# Patient Record
Sex: Female | Born: 1980 | Race: White | Hispanic: No | Marital: Married | Smoking: Never smoker
Health system: Southern US, Community
[De-identification: ages and names within clinical notes are randomized; demographics above are authoritative.]

## PROBLEM LIST (undated history)

## (undated) DIAGNOSIS — J45909 Unspecified asthma, uncomplicated: Secondary | ICD-10-CM

## (undated) DIAGNOSIS — I10 Essential (primary) hypertension: Secondary | ICD-10-CM

## (undated) DIAGNOSIS — E162 Hypoglycemia, unspecified: Secondary | ICD-10-CM

## (undated) DIAGNOSIS — R569 Unspecified convulsions: Secondary | ICD-10-CM

## (undated) HISTORY — DX: Essential (primary) hypertension: I10

## (undated) HISTORY — PX: ADENOIDECTOMY: SUR15

## (undated) HISTORY — PX: TONSILLECTOMY: SUR1361

## (undated) HISTORY — PX: EYE SURGERY: SHX253

---

## 2004-10-15 ENCOUNTER — Inpatient Hospital Stay (HOSPITAL_COMMUNITY): Admission: AD | Admit: 2004-10-15 | Discharge: 2004-10-15 | Payer: Self-pay | Admitting: Obstetrics

## 2004-10-20 ENCOUNTER — Inpatient Hospital Stay (HOSPITAL_COMMUNITY): Admission: AD | Admit: 2004-10-20 | Discharge: 2004-10-20 | Payer: Self-pay | Admitting: Obstetrics

## 2004-10-27 ENCOUNTER — Inpatient Hospital Stay (HOSPITAL_COMMUNITY): Admission: AD | Admit: 2004-10-27 | Discharge: 2004-10-30 | Payer: Self-pay | Admitting: Obstetrics

## 2006-10-21 ENCOUNTER — Ambulatory Visit (HOSPITAL_COMMUNITY): Admission: RE | Admit: 2006-10-21 | Discharge: 2006-10-21 | Payer: Self-pay | Admitting: Pulmonary Disease

## 2006-11-12 ENCOUNTER — Ambulatory Visit (HOSPITAL_COMMUNITY): Admission: RE | Admit: 2006-11-12 | Discharge: 2006-11-12 | Payer: Self-pay | Admitting: Pulmonary Disease

## 2009-10-13 ENCOUNTER — Other Ambulatory Visit: Admission: RE | Admit: 2009-10-13 | Discharge: 2009-10-13 | Payer: Self-pay | Admitting: Obstetrics & Gynecology

## 2010-05-25 ENCOUNTER — Inpatient Hospital Stay (HOSPITAL_COMMUNITY): Admission: AD | Admit: 2010-05-25 | Discharge: 2010-05-25 | Payer: Self-pay | Admitting: Obstetrics and Gynecology

## 2010-05-27 ENCOUNTER — Inpatient Hospital Stay (HOSPITAL_COMMUNITY): Admission: AD | Admit: 2010-05-27 | Discharge: 2010-05-29 | Payer: Self-pay | Admitting: Family Medicine

## 2010-05-27 ENCOUNTER — Ambulatory Visit: Payer: Self-pay | Admitting: Family Medicine

## 2010-10-06 ENCOUNTER — Emergency Department (HOSPITAL_COMMUNITY)
Admission: EM | Admit: 2010-10-06 | Discharge: 2010-10-06 | Disposition: A | Discharge status: Home / Self Care | Payer: Self-pay | Attending: Emergency Medicine | Admitting: Emergency Medicine

## 2010-10-06 DIAGNOSIS — R300 Dysuria: Secondary | ICD-10-CM | POA: Insufficient documentation

## 2010-10-06 DIAGNOSIS — N390 Urinary tract infection, site not specified: Secondary | ICD-10-CM | POA: Insufficient documentation

## 2010-10-06 LAB — URINALYSIS, ROUTINE W REFLEX MICROSCOPIC
Bilirubin Urine: NEGATIVE
Protein, ur: >300 mg/dL — AB
Urobilinogen, UA: 1 mg/dL (ref 0.0–1.0)

## 2010-10-06 LAB — URINE MICROSCOPIC-ADD ON

## 2010-10-08 LAB — URINE CULTURE
Colony Count: >=100000
Culture  Setup Time: 201203021935

## 2010-10-18 LAB — CBC
HCT: 24.2 % — ABNORMAL LOW (ref 36.0–46.0)
HCT: 30.3 % — ABNORMAL LOW (ref 36.0–46.0)
MCH: 28.5 pg (ref 26.0–34.0)
MCHC: 33.3 g/dL (ref 30.0–36.0)
MCHC: 33.7 g/dL (ref 30.0–36.0)
MCV: 85.4 fL (ref 78.0–100.0)
RDW: 18 % — ABNORMAL HIGH (ref 11.5–15.5)
RDW: 18.4 % — ABNORMAL HIGH (ref 11.5–15.5)

## 2010-10-18 LAB — ABO/RH: ABO/RH(D): O POS

## 2010-12-22 NOTE — H&P (Signed)
NAMESHALEA, Patricia Navarro              ACCOUNT NO.:  [id number]   MEDICAL RECORD NO.:  [id number]          PATIENT TYPE:  INP   LOCATION:  9169                          FACILITY:  [facility]   PHYSICIAN:  Roseanna Rainbow, M.D.DATE OF BIRTH:  1981/05/16   DATE OF ADMISSION:  10/27/2004  DATE OF DISCHARGE:                                HISTORY & PHYSICAL   CHIEF COMPLAINT:  The patient is a 30 year old para 1, with an estimated  date of confinement of November 07, 2004, with an intrauterine pregnancy at 44-  4/7th weeks, complaining of ruptured membranes and uterine contractions.  The patient reported a large gush of fluid approximately two hours to  presentation.  She was also complaining of mild uterine contractions.   ANTEPARTUM COURSE:  Prenatal care with Dr. Gaynell Face.   PROBLEMS/RISKS:  GBS positive on October 03, 2004.   PRENATAL SCREENS:  Hemoglobin 12.6, hematocrit 36.7, platelets 257,000.  Blood type O positive.  Rh negative.  RPR nonreactive.  Rubella immune.  Hepatitis B surface antigen negative.  HIV declined.  PPD negative.  Pap  negative.  Gonorrhea negative.  Chlamydia negative.  Triple screen negative.  One hour GCT 123.   PAST OBSTETRICAL/GYNECOLOGICAL HISTORY:  1.  In 1996, she was delivered of a female 6 pounds 9 ounces, full term      spontaneous vaginal delivery, no complications.  2.  She has a history of Chlamydia.   PAST MEDICAL HISTORY:  She denies.   PAST SURGICAL HISTORY:  She denies.   FAMILY HISTORY:  Noncontributory.   SOCIAL HISTORY:  No tobacco, ethanol, or substance abuse.   MEDICATIONS:  Prenatal vitamins.   ALLERGIES:  No known drug allergies.   PHYSICAL EXAMINATION:  VITAL SIGNS:  Stable, afebrile.  Fetal heart tracing  reassuring.  Tocodynamometer with irregular uterine contractions.  GENERAL:  No apparent distress.  ABDOMEN:  Gravid nontender.  PELVIC:  A sterile vaginal exam is per the nurse, 3-cm dilated, 80% effaced,  questionable full  bag palpable.  EXTREMITIES:  No clubbing, cyanosis, or edema.   ASSESSMENT:  1.  Primipara at term.  2.  Early labor.  3.  Group B strep positive.  4.  Fetal heart tracing consistent with fetal well being.   PLAN:  1.  Admission.  2.  Penicillin, GBS prophylaxis.  3.  Expected management.      LAJ/MEDQ  D:  10/28/2004  T:  10/28/2004  Job:  [id number]

## 2011-11-20 ENCOUNTER — Other Ambulatory Visit (HOSPITAL_COMMUNITY)
Admission: RE | Admit: 2011-11-20 | Discharge: 2011-11-20 | Disposition: A | Discharge status: Home / Self Care | Payer: Self-pay | Source: Ambulatory Visit | Attending: Unknown Physician Specialty | Admitting: Unknown Physician Specialty

## 2011-11-20 DIAGNOSIS — R87612 Low grade squamous intraepithelial lesion on cytologic smear of cervix (LGSIL): Secondary | ICD-10-CM | POA: Insufficient documentation

## 2011-11-20 DIAGNOSIS — R87619 Unspecified abnormal cytological findings in specimens from cervix uteri: Secondary | ICD-10-CM | POA: Insufficient documentation

## 2012-10-30 ENCOUNTER — Other Ambulatory Visit (HOSPITAL_COMMUNITY): Payer: Self-pay | Admitting: Pulmonary Disease

## 2012-10-30 DIAGNOSIS — R109 Unspecified abdominal pain: Secondary | ICD-10-CM

## 2012-10-31 ENCOUNTER — Ambulatory Visit (HOSPITAL_COMMUNITY)
Admission: RE | Admit: 2012-10-31 | Discharge: 2012-10-31 | Disposition: A | Discharge status: Home / Self Care | Payer: BC Managed Care – PPO | Source: Ambulatory Visit | Attending: Pulmonary Disease | Admitting: Pulmonary Disease

## 2012-10-31 DIAGNOSIS — R109 Unspecified abdominal pain: Secondary | ICD-10-CM | POA: Insufficient documentation

## 2015-01-31 ENCOUNTER — Emergency Department (HOSPITAL_COMMUNITY)
Admission: EM | Admit: 2015-01-31 | Discharge: 2015-01-31 | Disposition: A | Discharge status: Home / Self Care | Payer: 59 | Attending: Emergency Medicine | Admitting: Emergency Medicine

## 2015-01-31 ENCOUNTER — Encounter (HOSPITAL_COMMUNITY): Payer: Self-pay | Admitting: *Deleted

## 2015-01-31 DIAGNOSIS — W57XXXA Bitten or stung by nonvenomous insect and other nonvenomous arthropods, initial encounter: Secondary | ICD-10-CM | POA: Insufficient documentation

## 2015-01-31 DIAGNOSIS — S80862A Insect bite (nonvenomous), left lower leg, initial encounter: Secondary | ICD-10-CM | POA: Diagnosis not present

## 2015-01-31 DIAGNOSIS — L089 Local infection of the skin and subcutaneous tissue, unspecified: Secondary | ICD-10-CM | POA: Diagnosis not present

## 2015-01-31 DIAGNOSIS — S80861A Insect bite (nonvenomous), right lower leg, initial encounter: Secondary | ICD-10-CM

## 2015-01-31 DIAGNOSIS — Y998 Other external cause status: Secondary | ICD-10-CM | POA: Insufficient documentation

## 2015-01-31 DIAGNOSIS — Y9289 Other specified places as the place of occurrence of the external cause: Secondary | ICD-10-CM | POA: Diagnosis not present

## 2015-01-31 DIAGNOSIS — Z8639 Personal history of other endocrine, nutritional and metabolic disease: Secondary | ICD-10-CM | POA: Insufficient documentation

## 2015-01-31 DIAGNOSIS — Y9389 Activity, other specified: Secondary | ICD-10-CM | POA: Insufficient documentation

## 2015-01-31 HISTORY — DX: Hypoglycemia, unspecified: E16.2

## 2015-01-31 MED ORDER — SULFAMETHOXAZOLE-TRIMETHOPRIM 800-160 MG PO TABS: 1.0000 | ORAL_TABLET | Freq: Two times a day (BID) | ORAL | Status: AC

## 2015-01-31 NOTE — ED Provider Notes (Signed)
CSN: [id number]     Arrival date & time 01/31/15  1256 History  This chart was scribed for non-physician practitioner, Burgess Amor, PA-C working with Bethann Berkshire, MD by Gwenyth Ober, ED scribe. This patient was seen in room APFT23/APFT23 and the patient's care was started at 3:00 PM   Chief Complaint  Patient presents with  . Insect Bite   The history is provided by the patient. No language interpreter was used.    HPI Comments: Patricia Navarro is a 34 y.o. female who presents to the Emergency Department complaining of a gradually worsening, tingling, pruritic area of redness surrounding a central, red wound on her left lower leg that started yesterday. She states swelling of the affected area as an associated symptom. Pt tried Neosporin with no relief. Pt reports that she was cleaning her children's pool when she felt an insect bite, but did not see the insect. She denies a sensation of foreign bodies in the wound. Pt also denies fever and associated pain.  Past Medical History  Diagnosis Date  . Hypoglycemia    Past Surgical History  Procedure Laterality Date  . Tonsillectomy     History reviewed. No pertinent family history. History  Substance Use Topics  . Smoking status: Never Smoker   . Smokeless tobacco: Not on file  . Alcohol Use: No   OB History    No data available     Review of Systems  Constitutional: Negative for fever.  HENT: Negative for congestion and sore throat.   Eyes: Negative.   Respiratory: Negative for chest tightness and shortness of breath.   Cardiovascular: Negative for chest pain.  Gastrointestinal: Negative for nausea and abdominal pain.  Genitourinary: Negative.   Musculoskeletal: Negative for joint swelling, arthralgias and neck pain.  Skin: Positive for color change and wound. Negative for rash.  Neurological: Negative for dizziness, weakness, light-headedness, numbness and headaches.  Psychiatric/Behavioral: Negative.    Allergies   Review of patient's allergies indicates no known allergies.  Home Medications   Prior to Admission medications   Medication Sig Start Date End Date Taking? Authorizing Provider  sulfamethoxazole-trimethoprim (BACTRIM DS,SEPTRA DS) 800-160 MG per tablet Take 1 tablet by mouth 2 (two) times daily. 01/31/15 02/07/15  Burgess Amor, PA-C   BP 125/62 mmHg  Pulse 67  Temp(Src) 98.6 F (37 C) (Oral)  Resp 18  Ht 5\' 4"  (1.626 m)  Wt 180 lb (81.647 kg)  BMI 30.88 kg/m2  SpO2 100%  LMP 01/24/2015 Physical Exam  Constitutional: She appears well-developed and well-nourished. No distress.  HENT:  Head: Normocephalic and atraumatic.  Eyes: Conjunctivae and EOM are normal.  Neck: Neck supple. No tracheal deviation present.  Cardiovascular: Normal rate.   Pedal pulses intact  Pulmonary/Chest: Effort normal. No respiratory distress.  Skin: Skin is warm and dry. There is erythema.  Small 0.5 cm erythematous slightly indurated insect bite with 8.7 x 7 cm surrounding erythema without streaking or induration  Psychiatric: She has a normal mood and affect. Her behavior is normal.  Nursing note and vitals reviewed.   ED Course  Procedures   DIAGNOSTIC STUDIES: Oxygen Saturation is 97% on RA, normal by my interpretation.    COORDINATION OF CARE: 3:10 PM Discussed treatment plan with pt which includes antibiotics. Pt agreed to plan.   Labs Review Labs Reviewed - No data to display  Imaging Review No results found.   EKG Interpretation None      MDM   Final diagnoses:  Insect bite  of leg, infected, right, initial encounter    Exam c/w insect bite with surrounding cellulitis.  Pt was placed on bactrim, advised elevation, hot compresses, f/u with pcp for a recheck in 2 days, sooner for any significantly worsened pain or spreading redness.  Wound edges were marked.  I personally performed the services described in this documentation, which was scribed in my presence. The recorded  information has been reviewed and is accurate.   Burgess Amor, PA-C 02/02/15 2017  Bethann Berkshire, MD 02/04/15 [phone]

## 2015-01-31 NOTE — ED Notes (Addendum)
Site edges marked with dots of a marking pen.Dimensions of pt's insect bite are 8.7cm long and 7cm wide. PA aware.

## 2015-01-31 NOTE — Discharge Instructions (Signed)
Insect Bite Mosquitoes, flies, fleas, bedbugs, and many other insects can bite. Insect bites are different from insect stings. A sting is when venom is injected into the skin. Some insect bites can transmit infectious diseases. SYMPTOMS  Insect bites usually turn red, swell, and itch for 2 to 4 days. They often go away on their own. TREATMENT  Your caregiver may prescribe antibiotic medicines if a bacterial infection develops in the bite. HOME CARE INSTRUCTIONS  Do not scratch the bite area.  Keep the bite area clean and dry. Wash the bite area thoroughly with soap and water.  Put ice or cool compresses on the bite area.  Put ice in a plastic bag.  Place a towel between your skin and the bag.  Leave the ice on for 20 minutes, 4 times a day for the first 2 to 3 days, or as directed.  You may apply a baking soda paste, cortisone cream, or calamine lotion to the bite area as directed by your caregiver. This can help reduce itching and swelling.  Only take over-the-counter or prescription medicines as directed by your caregiver.  If you are given antibiotics, take them as directed. Finish them even if you start to feel better. You may need a tetanus shot if:  You cannot remember when you had your last tetanus shot.  You have never had a tetanus shot.  The injury broke your skin. If you get a tetanus shot, your arm may swell, get red, and feel warm to the touch. This is common and not a problem. If you need a tetanus shot and you choose not to have one, there is a rare chance of getting tetanus. Sickness from tetanus can be serious. SEEK IMMEDIATE MEDICAL CARE IF:   You have increased pain, redness, or swelling in the bite area.  You see a red line on the skin coming from the bite.  You have a fever.  You have joint pain.  You have a headache or neck pain.  You have unusual weakness.  You have a rash.  You have chest pain or shortness of breath.  You have abdominal pain,  nausea, or vomiting.  You feel unusually tired or sleepy. MAKE SURE YOU:   Understand these instructions.  Will watch your condition.  Will get help right away if you are not doing well or get worse. Document Released: 08/30/2004 Document Revised: 10/15/2011 Document Reviewed: 02/21/2011 [facility] Patient Information 2015 [place], [place]. This information is not intended to replace advice given to you by your health care provider. Make sure you discuss any questions you have with your health care provider.   You are being treated for a possibly infected insect bite although this could simply be a localized inflammatory reaction to the insect bite.  Use warm compresses, elevate your leg as much as possible.  Return here for a recheck in 2 days if your symptoms are not improving or you develop any worsened pain, swelling, spreading redness or changes to the bite site as discussed (ulceration, change in color).

## 2015-01-31 NOTE — ED Notes (Signed)
L lower leg spider bite yesterday.  Didn't see the spider.  Dark red central lesion w/lighter red spreading ring surrounding it.  Reports stomach upset and chills yesterday, but none today.

## 2015-03-18 ENCOUNTER — Other Ambulatory Visit: Payer: Self-pay | Admitting: Obstetrics & Gynecology

## 2015-03-18 DIAGNOSIS — O3680X0 Pregnancy with inconclusive fetal viability, not applicable or unspecified: Secondary | ICD-10-CM

## 2015-03-21 ENCOUNTER — Ambulatory Visit (INDEPENDENT_AMBULATORY_CARE_PROVIDER_SITE_OTHER): Payer: 59

## 2015-03-21 DIAGNOSIS — O3680X0 Pregnancy with inconclusive fetal viability, not applicable or unspecified: Secondary | ICD-10-CM | POA: Diagnosis not present

## 2015-03-21 NOTE — Progress Notes (Signed)
[place] [redacted]w[redacted]d single IUP w/ys,pos fht 168bpm,normal ov's bilat, edd 11/01/2015

## 2015-03-29 ENCOUNTER — Encounter: Payer: Self-pay | Admitting: Women's Health

## 2015-03-29 ENCOUNTER — Ambulatory Visit (INDEPENDENT_AMBULATORY_CARE_PROVIDER_SITE_OTHER): Payer: 59 | Admitting: Women's Health

## 2015-03-29 ENCOUNTER — Other Ambulatory Visit (HOSPITAL_COMMUNITY)
Admission: RE | Admit: 2015-03-29 | Discharge: 2015-03-29 | Disposition: A | Discharge status: Home / Self Care | Payer: 59 | Source: Ambulatory Visit | Attending: Obstetrics & Gynecology | Admitting: Obstetrics & Gynecology

## 2015-03-29 VITALS — BP 130/82 | HR 92 | Wt 188.5 lb

## 2015-03-29 DIAGNOSIS — R87810 Cervical high risk human papillomavirus (HPV) DNA test positive: Secondary | ICD-10-CM | POA: Diagnosis present

## 2015-03-29 DIAGNOSIS — Z01411 Encounter for gynecological examination (general) (routine) with abnormal findings: Secondary | ICD-10-CM | POA: Insufficient documentation

## 2015-03-29 DIAGNOSIS — Z1389 Encounter for screening for other disorder: Secondary | ICD-10-CM

## 2015-03-29 DIAGNOSIS — O09299 Supervision of pregnancy with other poor reproductive or obstetric history, unspecified trimester: Secondary | ICD-10-CM | POA: Insufficient documentation

## 2015-03-29 DIAGNOSIS — Z1151 Encounter for screening for human papillomavirus (HPV): Secondary | ICD-10-CM | POA: Diagnosis present

## 2015-03-29 DIAGNOSIS — Z0283 Encounter for blood-alcohol and blood-drug test: Secondary | ICD-10-CM

## 2015-03-29 DIAGNOSIS — Z3490 Encounter for supervision of normal pregnancy, unspecified, unspecified trimester: Secondary | ICD-10-CM | POA: Insufficient documentation

## 2015-03-29 DIAGNOSIS — O09291 Supervision of pregnancy with other poor reproductive or obstetric history, first trimester: Secondary | ICD-10-CM

## 2015-03-29 DIAGNOSIS — Z0189 Encounter for other specified special examinations: Secondary | ICD-10-CM

## 2015-03-29 DIAGNOSIS — Z113 Encounter for screening for infections with a predominantly sexual mode of transmission: Secondary | ICD-10-CM | POA: Insufficient documentation

## 2015-03-29 DIAGNOSIS — Z3491 Encounter for supervision of normal pregnancy, unspecified, first trimester: Secondary | ICD-10-CM

## 2015-03-29 DIAGNOSIS — Z36 Encounter for antenatal screening of mother: Secondary | ICD-10-CM

## 2015-03-29 DIAGNOSIS — Z3682 Encounter for antenatal screening for nuchal translucency: Secondary | ICD-10-CM

## 2015-03-29 DIAGNOSIS — O09521 Supervision of elderly multigravida, first trimester: Secondary | ICD-10-CM

## 2015-03-29 DIAGNOSIS — Z331 Pregnant state, incidental: Secondary | ICD-10-CM

## 2015-03-29 DIAGNOSIS — Z369 Encounter for antenatal screening, unspecified: Secondary | ICD-10-CM

## 2015-03-29 DIAGNOSIS — O09529 Supervision of elderly multigravida, unspecified trimester: Secondary | ICD-10-CM | POA: Insufficient documentation

## 2015-03-29 LAB — POCT URINALYSIS DIPSTICK
GLUCOSE UA: NEGATIVE
Ketones, UA: NEGATIVE
Leukocytes, UA: NEGATIVE
NITRITE UA: NEGATIVE
PROTEIN UA: NEGATIVE
RBC UA: NEGATIVE

## 2015-03-29 NOTE — Progress Notes (Signed)
  Subjective:  Patricia Navarro is a 34 y.o. [phone] Caucasian female at [redacted]w[redacted]d by LMP c/w 8wk u/s, being seen today for her first obstetrical visit.  Her obstetrical history is significant for AMA, h/o term svb x 3, last one weighed 9lb and had 30sec shoulder dystocia resolved w/ mcrobert's & suprapubic pressure- no GDM- states she gained a lot more weight w/ that pregnancy.  Pregnancy history fully reviewed.  Patient reports mild nausea- declines meds. Denies vb, cramping, uti s/s, abnormal/malodorous vag d/c, or vulvovaginal itching/irritation.  BP 130/82 mmHg  Pulse 92  Wt 188 lb 8 oz (85.503 kg)  LMP 01/25/2015 (Exact Date)  HISTORY: OB History  Gravida Para Term Preterm AB SAB TAB Ectopic Multiple Living  # Outcome Date GA Lbr Len/2nd Weight Sex Delivery Anes PTL Lv  4 Current           3 Term 05/28/10 [redacted]w[redacted]d  9 lb (4.082 kg) F Vag-Spont   Y  2 Term 10/28/04 [redacted]w[redacted]d  8 lb 6 oz (3.799 kg) M Vag-Spont   Y  1 Term 06/03/95 [redacted]w[redacted]d  6 lb 9 oz (2.977 kg) F Vag-Spont   Y     Past Medical History  Diagnosis Date  . Hypoglycemia    Past Surgical History  Procedure Laterality Date  . Tonsillectomy     Family History  Problem Relation Age of Onset  . Thyroid disease Mother   . Heart disease Mother   . Asthma Mother   . ADD / ADHD Son   . Heart attack Maternal Grandmother   . Thyroid disease Maternal Grandmother   . Cancer Maternal Grandfather   . Cancer Paternal Grandmother     breast  . Other Paternal Grandmother     aneursym  . Emphysema Paternal Grandfather     Exam   System:     General: Well developed & nourished, no acute distress   Skin: Warm & dry, normal coloration and turgor, no rashes   Neurologic: Alert & oriented, normal mood   Cardiovascular: Regular rate & rhythm   Respiratory: Effort & rate normal, LCTAB, acyanotic   Abdomen: Soft, non tender   Extremities: normal strength, tone   Pelvic Exam:    Perineum: Normal perineum   Vulva:  Normal, no lesions   Vagina:  Normal mucosa, normal discharge   Cervix: Normal, bulbous, appears closed   Uterus: Normal size/shape/contour for GA   Thin prep pap smear obtained w/ high risk HPV cotesting FHR: 167 via informal u/s   Assessment:   Pregnancy: [id number] Patient Active Problem List   Diagnosis Date Noted  . Supervision of normal pregnancy 03/29/2015    Priority: High    [redacted]w[redacted]d [id number] New OB visit H/O shoulder dystocia AMA  Plan:  Initial labs drawn Continue prenatal vitamins Problem list reviewed and updated Reviewed n/v relief measures and warning s/s to report Reviewed recommended weight gain based on pre-gravid BMI Encouraged well-balanced diet Genetic Screening discussed Integrated Screen: requested Cystic fibrosis screening discussed requested Ultrasound discussed; fetal survey: requested Follow up in 3 weeks for 1st it/nt and visit [facility] completed  Marge Duncans CNM, [facility] 03/29/2015 4:35 PM

## 2015-03-29 NOTE — Patient Instructions (Signed)

## 2015-03-31 LAB — URINE CULTURE

## 2015-04-01 LAB — CYTOLOGY - PAP

## 2015-04-04 ENCOUNTER — Encounter: Payer: Self-pay | Admitting: Women's Health

## 2015-04-04 DIAGNOSIS — R87810 Cervical high risk human papillomavirus (HPV) DNA test positive: Secondary | ICD-10-CM | POA: Insufficient documentation

## 2015-04-05 ENCOUNTER — Telehealth: Payer: Self-pay | Admitting: Women's Health

## 2015-04-05 LAB — ANTIBODY SCREEN: ANTIBODY SCREEN: NEGATIVE

## 2015-04-05 LAB — PMP SCREEN PROFILE (10S), URINE
AMPHETAMINE SCRN UR: NEGATIVE ng/mL
Barbiturate Screen, Ur: NEGATIVE ng/mL
Benzodiazepine Screen, Urine: NEGATIVE ng/mL
CANNABINOIDS UR QL SCN: NEGATIVE ng/mL
CREATININE(CRT), U: 196.4 mg/dL (ref 20.0–300.0)
Cocaine(Metab.)Screen, Urine: NEGATIVE ng/mL
METHADONE SCREEN, URINE: NEGATIVE ng/mL
OXYCODONE+OXYMORPHONE UR QL SCN: NEGATIVE ng/mL
Opiate Scrn, Ur: NEGATIVE ng/mL
PCP SCRN UR: NEGATIVE ng/mL
PH UR, DRUG SCRN: 5.6 (ref 4.5–8.9)
Propoxyphene, Screen: NEGATIVE ng/mL

## 2015-04-05 LAB — CYSTIC FIBROSIS MUTATION 97: GENE DIS ANAL CARRIER INTERP BLD/T-IMP: NOT DETECTED

## 2015-04-05 LAB — CBC
HEMOGLOBIN: 11.7 g/dL (ref 11.1–15.9)
Hematocrit: 34.6 % (ref 34.0–46.6)
MCH: 29.8 pg (ref 26.6–33.0)
MCHC: 33.8 g/dL (ref 31.5–35.7)
MCV: 88 fL (ref 79–97)
PLATELETS: 276 10*3/uL (ref 150–379)
RBC: 3.93 x10E6/uL (ref 3.77–5.28)
RDW: 13.6 % (ref 12.3–15.4)
WBC: 10.1 10*3/uL (ref 3.4–10.8)

## 2015-04-05 LAB — URINALYSIS, ROUTINE W REFLEX MICROSCOPIC
BILIRUBIN UA: NEGATIVE
GLUCOSE, UA: NEGATIVE
KETONES UA: NEGATIVE
LEUKOCYTES UA: NEGATIVE
Nitrite, UA: NEGATIVE
PROTEIN UA: NEGATIVE
RBC UA: NEGATIVE
SPEC GRAV UA: 1.029 (ref 1.005–1.030)
Urobilinogen, Ur: 0.2 mg/dL (ref 0.2–1.0)
pH, UA: 6 (ref 5.0–7.5)

## 2015-04-05 LAB — VARICELLA ZOSTER ANTIBODY, IGG: Varicella zoster IgG: 1116 index (ref >165)

## 2015-04-05 LAB — ABO/RH: Rh Factor: POSITIVE

## 2015-04-05 LAB — RPR: RPR: NONREACTIVE

## 2015-04-05 LAB — HIV ANTIBODY (ROUTINE TESTING W REFLEX): HIV Screen 4th Generation wRfx: NONREACTIVE

## 2015-04-05 LAB — RUBELLA SCREEN: Rubella Antibodies, IGG: 3.98 index (ref >0.99)

## 2015-04-05 LAB — HEPATITIS B SURFACE ANTIGEN: HEP B S AG: NEGATIVE

## 2015-04-05 LAB — SICKLE CELL SCREEN: SICKLE CELL SCREEN: NEGATIVE

## 2015-04-05 NOTE — Telephone Encounter (Signed)
Notified of normal pap w/ +HRHPV, will need pap in 1 yr.  Cheral Marker, CNM, [facility] 04/05/2015 1:57 PM

## 2015-04-19 ENCOUNTER — Ambulatory Visit (INDEPENDENT_AMBULATORY_CARE_PROVIDER_SITE_OTHER): Payer: 59 | Admitting: Advanced Practice Midwife

## 2015-04-19 ENCOUNTER — Ambulatory Visit (INDEPENDENT_AMBULATORY_CARE_PROVIDER_SITE_OTHER): Payer: 59

## 2015-04-19 VITALS — BP 118/78 | HR 84 | Wt 188.0 lb

## 2015-04-19 DIAGNOSIS — Z3682 Encounter for antenatal screening for nuchal translucency: Secondary | ICD-10-CM

## 2015-04-19 DIAGNOSIS — Z36 Encounter for antenatal screening of mother: Secondary | ICD-10-CM | POA: Diagnosis not present

## 2015-04-19 DIAGNOSIS — Z331 Pregnant state, incidental: Secondary | ICD-10-CM

## 2015-04-19 DIAGNOSIS — Z1389 Encounter for screening for other disorder: Secondary | ICD-10-CM

## 2015-04-19 DIAGNOSIS — Z3491 Encounter for supervision of normal pregnancy, unspecified, first trimester: Secondary | ICD-10-CM

## 2015-04-19 LAB — POCT URINALYSIS DIPSTICK
Glucose, UA: NEGATIVE
KETONES UA: NEGATIVE
LEUKOCYTES UA: NEGATIVE
NITRITE UA: NEGATIVE
PROTEIN UA: NEGATIVE
RBC UA: NEGATIVE

## 2015-04-19 NOTE — Progress Notes (Signed)
[id number] [redacted]w[redacted]d Estimated Date of Delivery: 11/01/15  Blood pressure 118/78, pulse 84, weight 188 lb (85.276 kg), last menstrual period 01/25/2015.   BP weight and urine results all reviewed and noted.  Please refer to the obstetrical flow sheet for the fundal height and fetal heart rate documentation:  Had NT/IT today:   [place] 12wks single IUP pos fht 163bpm,normal ov's bilat,NT 2.67mm,NB present,CRL 59.34mm        Patient  denies any bleeding and no rupture of membranes symptoms or regular contractions. Patient is without complaints. All questions were answered.  Orders Placed This Encounter  Procedures  . Maternal Screen, Integrated #1  . POCT urinalysis dipstick    Plan:  Continued routine obstetrical care,   Return in about 4 weeks (around 05/17/2015) for LROB, 2nd IT.

## 2015-04-19 NOTE — Progress Notes (Signed)
[place] 12wks single IUP pos fht 163bpm,normal ov's bilat,NT 2.107mm,NB present,CRL 59.45mm

## 2015-04-21 LAB — MATERNAL SCREEN, INTEGRATED #1
CROWN RUMP LENGTH MAT SCREEN: 59.7 mm
GEST. AGE ON COLLECTION DATE: 12.4 wk
MATERNAL AGE AT EDD: 36.7 a
NUMBER OF FETUSES: 1
Nuchal Translucency (NT): 2 mm
PAPP-A Value: 1140.4 ng/mL
WEIGHT: 188 [lb_av]

## 2015-05-17 ENCOUNTER — Ambulatory Visit (INDEPENDENT_AMBULATORY_CARE_PROVIDER_SITE_OTHER): Payer: 59 | Admitting: Women's Health

## 2015-05-17 VITALS — BP 110/72 | HR 84 | Wt 189.0 lb

## 2015-05-17 DIAGNOSIS — Z1389 Encounter for screening for other disorder: Secondary | ICD-10-CM

## 2015-05-17 DIAGNOSIS — Z363 Encounter for antenatal screening for malformations: Secondary | ICD-10-CM

## 2015-05-17 DIAGNOSIS — Z23 Encounter for immunization: Secondary | ICD-10-CM | POA: Diagnosis not present

## 2015-05-17 DIAGNOSIS — Z331 Pregnant state, incidental: Secondary | ICD-10-CM

## 2015-05-17 DIAGNOSIS — Z3492 Encounter for supervision of normal pregnancy, unspecified, second trimester: Secondary | ICD-10-CM

## 2015-05-17 LAB — POCT URINALYSIS DIPSTICK
Blood, UA: NEGATIVE
Glucose, UA: NEGATIVE
KETONES UA: NEGATIVE
LEUKOCYTES UA: NEGATIVE
NITRITE UA: NEGATIVE
PROTEIN UA: NEGATIVE

## 2015-05-17 NOTE — Patient Instructions (Addendum)
For your lower back pain you may:  Purchase a pregnancy belt from [employer] [place], [employer], [employer], etc and wear it while you are up and about  Take warm baths  Use a heating pad to your lower back for no longer than 20 minutes at a time, and do not place near abdomen  Take tylenol as needed. Please follow directions on the bottle  Second Trimester of Pregnancy The second trimester is from week 13 through week 28, months 4 through 6. The second trimester is often a time when you feel your best. Your body has also adjusted to being pregnant, and you begin to feel better physically. Usually, morning sickness has lessened or quit completely, you may have more energy, and you may have an increase in appetite. The second trimester is also a time when the fetus is growing rapidly. At the end of the sixth month, the fetus is about 9 inches long and weighs about 1 pounds. You will likely begin to feel the baby move (quickening) between 18 and 20 weeks of the pregnancy. BODY CHANGES Your body goes through many changes during pregnancy. The changes vary from woman to woman.   Your weight will continue to increase. You will notice your lower abdomen bulging out.  You may begin to get stretch marks on your hips, abdomen, and breasts.  You may develop headaches that can be relieved by medicines approved by your health care provider.  You may urinate more often because the fetus is pressing on your bladder.  You may develop or continue to have heartburn as a result of your pregnancy.  You may develop constipation because certain hormones are causing the muscles that push waste through your intestines to slow down.  You may develop hemorrhoids or swollen, bulging veins (varicose veins).  You may have back pain because of the weight gain and pregnancy hormones relaxing your joints between the bones in your pelvis and as a result of a shift in weight and the muscles that support your  balance.  Your breasts will continue to grow and be tender.  Your gums may bleed and may be sensitive to brushing and flossing.  Dark spots or blotches (chloasma, mask of pregnancy) may develop on your face. This will likely fade after the baby is born.  A dark line from your belly button to the pubic area (linea nigra) may appear. This will likely fade after the baby is born.  You may have changes in your hair. These can include thickening of your hair, rapid growth, and changes in texture. Some women also have hair loss during or after pregnancy, or hair that feels dry or thin. Your hair will most likely return to normal after your baby is born. WHAT TO EXPECT AT YOUR PRENATAL VISITS During a routine prenatal visit:  You will be weighed to make sure you and the fetus are growing normally.  Your blood pressure will be taken.  Your abdomen will be measured to track your baby's growth.  The fetal heartbeat will be listened to.  Any test results from the previous visit will be discussed. Your health care provider may ask you:  How you are feeling.  If you are feeling the baby move.  If you have had any abnormal symptoms, such as leaking fluid, bleeding, severe headaches, or abdominal cramping.  If you are using any tobacco products, including cigarettes, chewing tobacco, and electronic cigarettes.  If you have any questions. Other tests that may be  performed during your second trimester include:  Blood tests that check for:  Low iron levels (anemia).  Gestational diabetes (between 24 and 28 weeks).  Rh antibodies.  Urine tests to check for infections, diabetes, or protein in the urine.  An ultrasound to confirm the proper growth and development of the baby.  An amniocentesis to check for possible genetic problems.  Fetal screens for spina bifida and Down syndrome.  HIV (human immunodeficiency virus) testing. Routine prenatal testing includes screening for HIV, unless  you choose not to have this test. HOME CARE INSTRUCTIONS  5. Avoid all smoking, herbs, alcohol, and unprescribed drugs. These chemicals affect the formation and growth of the baby. 6. Do not use any tobacco products, including cigarettes, chewing tobacco, and electronic cigarettes. If you need help quitting, ask your health care provider. You may receive counseling support and other resources to help you quit. 7. Follow your health care provider's instructions regarding medicine use. There are medicines that are either safe or unsafe to take during pregnancy. 8. Exercise only as directed by your health care provider. Experiencing uterine cramps is a good sign to stop exercising. 9. Continue to eat regular, healthy meals. 10. Wear a good support bra for breast tenderness. 11. Do not use hot tubs, steam rooms, or saunas. 12. Wear your seat belt at all times when driving. 13. Avoid raw meat, uncooked cheese, cat litter boxes, and soil used by cats. These carry germs that can cause birth defects in the baby. 14. Take your prenatal vitamins. 15. Take 1500-2000 mg of calcium daily starting at the 20th week of pregnancy until you deliver your baby. 16. Try taking a stool softener (if your health care provider approves) if you develop constipation. Eat more high-fiber foods, such as fresh vegetables or fruit and whole grains. Drink plenty of fluids to keep your urine clear or pale yellow. 17. Take warm sitz baths to soothe any pain or discomfort caused by hemorrhoids. Use hemorrhoid cream if your health care provider approves. 18. If you develop varicose veins, wear support hose. Elevate your feet for 15 minutes, 3-4 times a day. Limit salt in your diet. 19. Avoid heavy lifting, wear low heel shoes, and practice good posture. 20. Rest with your legs elevated if you have leg cramps or low back pain. 21. Visit your dentist if you have not gone yet during your pregnancy. Use a soft toothbrush to brush your  teeth and be gentle when you floss. 22. A sexual relationship may be continued unless your health care provider directs you otherwise. 23. Continue to go to all your prenatal visits as directed by your health care provider. SEEK MEDICAL CARE IF:   You have dizziness.  You have mild pelvic cramps, pelvic pressure, or nagging pain in the abdominal area.  You have persistent nausea, vomiting, or diarrhea.  You have a bad smelling vaginal discharge.  You have pain with urination. SEEK IMMEDIATE MEDICAL CARE IF:   You have a fever.  You are leaking fluid from your vagina.  You have spotting or bleeding from your vagina.  You have severe abdominal cramping or pain.  You have rapid weight gain or loss.  You have shortness of breath with chest pain.  You notice sudden or extreme swelling of your face, hands, ankles, feet, or legs.  You have not felt your baby move in over an hour.  You have severe headaches that do not go away with medicine.  You have vision changes.  This information is not intended to replace advice given to you by your health care provider. Make sure you discuss any questions you have with your health care provider.   Document Released: 07/17/2001 Document Revised: 08/13/2014 Document Reviewed: 09/23/2012 Elsevier Interactive Patient Education [employer].

## 2015-05-17 NOTE — Progress Notes (Signed)
Low-risk OB appointment [id number] [redacted]w[redacted]d Estimated Date of Delivery: 11/01/15 BP 110/72 mmHg  Pulse 84  Wt 189 lb (85.73 kg)  LMP 01/25/2015 (Exact Date)  BP, weight, and urine reviewed.  Refer to obstetrical flow sheet for FH & FHR.  No fm yet. Denies cramping, lof, vb, or uti s/s. Some LBP- gave printed info.  Reviewed warning s/s to report. Plan:  Continue routine obstetrical care  F/U in 4wks for OB appointment and anatomy u/s Flu shot today

## 2015-05-19 LAB — MATERNAL SCREEN, INTEGRATED #2
ADSF: 1.54
AFP MOM: 1.23
Alpha-Fetoprotein: 33.2 ng/mL
Crown Rump Length: 59.7 mm
DIA MoM: 0.85
DIA VALUE: 128.6 pg/mL
ESTRIOL UNCONJUGATED: 1.35 ng/mL
GEST. AGE ON COLLECTION DATE: 12.4 wk
Gestational Age: 16.4 weeks
HCG MOM: 1.65
HCG VALUE: 45.8 [IU]/mL
MATERNAL AGE AT EDD: 36.7 a
NUCHAL TRANSLUCENCY (NT): 2 mm
NUMBER OF FETUSES: 1
Nuchal Translucency MoM: 1.31
PAPP-A MoM: 1.58
PAPP-A VALUE: 1140.4 ng/mL
Test Results:: NEGATIVE
WEIGHT: 188 [lb_av]
WEIGHT: 189 [lb_av]

## 2015-06-14 ENCOUNTER — Ambulatory Visit (INDEPENDENT_AMBULATORY_CARE_PROVIDER_SITE_OTHER): Payer: 59

## 2015-06-14 ENCOUNTER — Encounter: Payer: Self-pay | Admitting: Women's Health

## 2015-06-14 ENCOUNTER — Ambulatory Visit (INDEPENDENT_AMBULATORY_CARE_PROVIDER_SITE_OTHER): Payer: 59 | Admitting: Women's Health

## 2015-06-14 VITALS — BP 116/68 | HR 76 | Wt 190.0 lb

## 2015-06-14 DIAGNOSIS — Z36 Encounter for antenatal screening of mother: Secondary | ICD-10-CM

## 2015-06-14 DIAGNOSIS — Z1389 Encounter for screening for other disorder: Secondary | ICD-10-CM

## 2015-06-14 DIAGNOSIS — Z363 Encounter for antenatal screening for malformations: Secondary | ICD-10-CM

## 2015-06-14 DIAGNOSIS — Z3492 Encounter for supervision of normal pregnancy, unspecified, second trimester: Secondary | ICD-10-CM

## 2015-06-14 DIAGNOSIS — Z331 Pregnant state, incidental: Secondary | ICD-10-CM

## 2015-06-14 LAB — POCT URINALYSIS DIPSTICK
Blood, UA: NEGATIVE
GLUCOSE UA: NEGATIVE
KETONES UA: NEGATIVE
Leukocytes, UA: NEGATIVE
Nitrite, UA: NEGATIVE
PROTEIN UA: NEGATIVE

## 2015-06-14 NOTE — Progress Notes (Signed)
Low-risk OB appointment [id number] Estimated Date of Delivery: 11/01/15 BP 116/68 mmHg  Pulse 76  Wt 190 lb (86.183 kg)  LMP 01/25/2015 (Exact Date)  BP, weight, and urine reviewed.  Refer to obstetrical flow sheet for FH & FHR.  Reports good fm.  Denies regular uc's, lof, vb, or uti s/s. No complaints. Reviewed today's normal anatomy u/s, ptl s/s, fm. Plan:  Continue routine obstetrical care  F/U in 4wks for OB appointment

## 2015-06-14 NOTE — Patient Instructions (Signed)

## 2015-06-14 NOTE — Progress Notes (Signed)
US 20wks,measurements c/w dates,ant pl gr 0,breech,normal ov's bilat,cx 3.8cm,normal ov's bilat,svp of fluid 6.5cm,fhr 126bpm,efw 415 g, anatomy complete no obvious abn seen

## 2015-07-12 ENCOUNTER — Encounter: Payer: Self-pay | Admitting: Women's Health

## 2015-07-12 ENCOUNTER — Ambulatory Visit (INDEPENDENT_AMBULATORY_CARE_PROVIDER_SITE_OTHER): Payer: 59 | Admitting: Women's Health

## 2015-07-12 VITALS — BP 112/70 | HR 80 | Wt 194.0 lb

## 2015-07-12 DIAGNOSIS — Z3A24 24 weeks gestation of pregnancy: Secondary | ICD-10-CM

## 2015-07-12 DIAGNOSIS — R350 Frequency of micturition: Secondary | ICD-10-CM

## 2015-07-12 DIAGNOSIS — Z331 Pregnant state, incidental: Secondary | ICD-10-CM

## 2015-07-12 DIAGNOSIS — Z3492 Encounter for supervision of normal pregnancy, unspecified, second trimester: Secondary | ICD-10-CM

## 2015-07-12 DIAGNOSIS — Z1389 Encounter for screening for other disorder: Secondary | ICD-10-CM

## 2015-07-12 LAB — POCT URINALYSIS DIPSTICK
Blood, UA: NEGATIVE
Glucose, UA: NEGATIVE
Ketones, UA: NEGATIVE
Leukocytes, UA: NEGATIVE
Nitrite, UA: NEGATIVE
Protein, UA: NEGATIVE

## 2015-07-12 NOTE — Patient Instructions (Signed)
You will have your sugar test next visit.  Please do not eat or drink anything after midnight the night before you come, not even water.  You will be here for at least two hours.     Call the office ([phone]) or go to [facility] if:  You begin to have strong, frequent contractions  Your water breaks.  Sometimes it is a big gush of fluid, sometimes it is just a trickle that keeps getting your panties wet or running down your legs  You have vaginal bleeding.  It is normal to have a small amount of spotting if your cervix was checked.   You don't feel your baby moving like normal.  If you don't, get you something to eat and drink and lay down and focus on feeling your baby move.   If your baby is still not moving like normal, you should call the office or go to [facility].  Second Trimester of Pregnancy The second trimester is from week 13 through week 28, months 4 through 6. The second trimester is often a time when you feel your best. Your body has also adjusted to being pregnant, and you begin to feel better physically. Usually, morning sickness has lessened or quit completely, you may have more energy, and you may have an increase in appetite. The second trimester is also a time when the fetus is growing rapidly. At the end of the sixth month, the fetus is about 9 inches long and weighs about 1 pounds. You will likely begin to feel the baby move (quickening) between 18 and 20 weeks of the pregnancy. BODY CHANGES Your body goes through many changes during pregnancy. The changes vary from woman to woman.   Your weight will continue to increase. You will notice your lower abdomen bulging out.  You may begin to get stretch marks on your hips, abdomen, and breasts.  You may develop headaches that can be relieved by medicines approved by your health care provider.  You may urinate more often because the fetus is pressing on your bladder.  You may develop or continue to have  heartburn as a result of your pregnancy.  You may develop constipation because certain hormones are causing the muscles that push waste through your intestines to slow down.  You may develop hemorrhoids or swollen, bulging veins (varicose veins).  You may have back pain because of the weight gain and pregnancy hormones relaxing your joints between the bones in your pelvis and as a result of a shift in weight and the muscles that support your balance.  Your breasts will continue to grow and be tender.  Your gums may bleed and may be sensitive to brushing and flossing.  Dark spots or blotches (chloasma, mask of pregnancy) may develop on your face. This will likely fade after the baby is born.  A dark line from your belly button to the pubic area (linea nigra) may appear. This will likely fade after the baby is born.  You may have changes in your hair. These can include thickening of your hair, rapid growth, and changes in texture. Some women also have hair loss during or after pregnancy, or hair that feels dry or thin. Your hair will most likely return to normal after your baby is born. WHAT TO EXPECT AT YOUR PRENATAL VISITS During a routine prenatal visit:  You will be weighed to make sure you and the fetus are growing normally.  Your blood pressure will be taken.    Your abdomen will be measured to track your baby's growth.  The fetal heartbeat will be listened to.  Any test results from the previous visit will be discussed. Your health care provider may ask you:  How you are feeling.  If you are feeling the baby move.  If you have had any abnormal symptoms, such as leaking fluid, bleeding, severe headaches, or abdominal cramping.  If you have any questions. Other tests that may be performed during your second trimester include:  Blood tests that check for:  Low iron levels (anemia).  Gestational diabetes (between 24 and 28 weeks).  Rh antibodies.  Urine tests to check  for infections, diabetes, or protein in the urine.  An ultrasound to confirm the proper growth and development of the baby.  An amniocentesis to check for possible genetic problems.  Fetal screens for spina bifida and Down syndrome. HOME CARE INSTRUCTIONS   Avoid all smoking, herbs, alcohol, and unprescribed drugs. These chemicals affect the formation and growth of the baby.  Follow your health care provider's instructions regarding medicine use. There are medicines that are either safe or unsafe to take during pregnancy.  Exercise only as directed by your health care provider. Experiencing uterine cramps is a good sign to stop exercising.  Continue to eat regular, healthy meals.  Wear a good support bra for breast tenderness.  Do not use hot tubs, steam rooms, or saunas.  Wear your seat belt at all times when driving.  Avoid raw meat, uncooked cheese, cat litter boxes, and soil used by cats. These carry germs that can cause birth defects in the baby.  Take your prenatal vitamins.  Try taking a stool softener (if your health care provider approves) if you develop constipation. Eat more high-fiber foods, such as fresh vegetables or fruit and whole grains. Drink plenty of fluids to keep your urine clear or pale yellow.  Take warm sitz baths to soothe any pain or discomfort caused by hemorrhoids. Use hemorrhoid cream if your health care provider approves.  If you develop varicose veins, wear support hose. Elevate your feet for 15 minutes, 3-4 times a day. Limit salt in your diet.  Avoid heavy lifting, wear low heel shoes, and practice good posture.  Rest with your legs elevated if you have leg cramps or low back pain.  Visit your dentist if you have not gone yet during your pregnancy. Use a soft toothbrush to brush your teeth and be gentle when you floss.  A sexual relationship may be continued unless your health care provider directs you otherwise.  Continue to go to all your  prenatal visits as directed by your health care provider. SEEK MEDICAL CARE IF:   You have dizziness.  You have mild pelvic cramps, pelvic pressure, or nagging pain in the abdominal area.  You have persistent nausea, vomiting, or diarrhea.  You have a bad smelling vaginal discharge.  You have pain with urination. SEEK IMMEDIATE MEDICAL CARE IF:   You have a fever.  You are leaking fluid from your vagina.  You have spotting or bleeding from your vagina.  You have severe abdominal cramping or pain.  You have rapid weight gain or loss.  You have shortness of breath with chest pain.  You notice sudden or extreme swelling of your face, hands, ankles, feet, or legs.  You have not felt your baby move in over an hour.  You have severe headaches that do not go away with medicine.  You have vision changes.   Document Released: 07/17/2001 Document Revised: 07/28/2013 Document Reviewed: 09/23/2012 [employer] Patient Information 2015 [employer]. This information is not intended to replace advice given to you by your health care provider. Make sure you discuss any questions you have with your health care provider.     

## 2015-07-12 NOTE — Progress Notes (Signed)
Low-risk OB appointment [id number] Estimated Date of Delivery: 11/01/15 BP 112/70 mmHg  Pulse 80  Wt 194 lb (87.998 kg)  LMP 01/25/2015 (Exact Date)  BP, weight, and urine reviewed.  Refer to obstetrical flow sheet for FH & FHR.  Reports good fm.  Denies regular uc's, lof, vb. Increased urinary frequency- mainly at night- denies dysuria, some urgency. Dipstick neg- will send cx.  Reviewed ptl s/s, fm. Plan:  Continue routine obstetrical care  F/U in 4wks for OB appointment and pn2

## 2015-07-14 LAB — URINE CULTURE: ORGANISM ID, BACTERIA: NO GROWTH

## 2015-07-27 ENCOUNTER — Ambulatory Visit (INDEPENDENT_AMBULATORY_CARE_PROVIDER_SITE_OTHER): Payer: 59 | Admitting: Advanced Practice Midwife

## 2015-07-27 VITALS — BP 104/62 | HR 87 | Wt 194.0 lb

## 2015-07-27 DIAGNOSIS — Z1389 Encounter for screening for other disorder: Secondary | ICD-10-CM

## 2015-07-27 DIAGNOSIS — Z3492 Encounter for supervision of normal pregnancy, unspecified, second trimester: Secondary | ICD-10-CM

## 2015-07-27 DIAGNOSIS — Z331 Pregnant state, incidental: Secondary | ICD-10-CM

## 2015-07-27 LAB — POCT URINALYSIS DIPSTICK
Blood, UA: NEGATIVE
GLUCOSE UA: NEGATIVE
KETONES UA: NEGATIVE
Leukocytes, UA: NEGATIVE
NITRITE UA: NEGATIVE

## 2015-07-27 MED ORDER — ALBUTEROL SULFATE HFA 108 (90 BASE) MCG/ACT IN AERS: 2.0000 | INHALATION_SPRAY | Freq: Four times a day (QID) | RESPIRATORY_TRACT | Status: AC | PRN

## 2015-07-27 MED ORDER — VALACYCLOVIR HCL 1 G PO TABS: 2000.0000 mg | ORAL_TABLET | Freq: Two times a day (BID) | ORAL | Status: DC

## 2015-07-27 MED ORDER — DEXTROMETHORPHAN-GUAIFENESIN 5-100 MG/5ML PO LIQD: 1.0000 mL | Freq: Four times a day (QID) | ORAL | Status: DC | PRN

## 2015-07-27 NOTE — Progress Notes (Signed)
[id number] Estimated Date of Delivery: 11/01/15  Blood pressure 104/62, pulse 87, weight 194 lb (87.998 kg), last menstrual period 01/25/2015.   WORK IN FOR COUGH.  Has had a cough and no congestion for 5 days. Cough is in response to smelling harsh candles/chemicals at work.  Feels deep in chest.  Has used an inhaler in the past with relief.    Feels fever blister coming on  BP weight and urine results all reviewed and noted.   Patient reports good fetal movement, denies any bleeding and no rupture of membranes symptoms or regular contractions.  LCTAB, heart regular rate/rhythm No lymphopathy, sinuses non tender.   Top of lip sl erythemous . All questions were answered.  Orders Placed This Encounter  Procedures  . POCT urinalysis dipstick    Plan:  Continued routine obstetrical care,   Rx albuterol inhaler prn Delsym cough syrup Valtrex 2gm q 12 hr X 2 doses  Return for As scheduled.

## 2015-08-07 NOTE — L&D Delivery Note (Signed)
Patient is 35 y.o. [id number] admitted for IOL for gHTN   Delivery Note At 7:18 AM a viable female was delivered via Vaginal, Spontaneous Delivery (Presentation: Left Occiput Anterior).  APGAR: 7, 9; weight 8 lb 8.9 oz (3880 g).   Placenta status: Intact, Spontaneous.  Cord: 3 vessels with the following complications: None.  Cord pH: n/a  Anesthesia: Epidural  Episiotomy: None Lacerations: None Suture Repair: n/a Est. Blood Loss (mL): 100  Mom to postpartum.  Baby to Couplet care / Skin to Skin.  Patricia Dinninghristina M Jayda White, MD 10/27/2015, 3:01 PM

## 2015-08-10 ENCOUNTER — Ambulatory Visit (INDEPENDENT_AMBULATORY_CARE_PROVIDER_SITE_OTHER): Payer: 59 | Admitting: Advanced Practice Midwife

## 2015-08-10 ENCOUNTER — Other Ambulatory Visit: Payer: 59

## 2015-08-10 VITALS — BP 104/60 | HR 80 | Wt 196.5 lb

## 2015-08-10 DIAGNOSIS — Z331 Pregnant state, incidental: Secondary | ICD-10-CM

## 2015-08-10 DIAGNOSIS — Z131 Encounter for screening for diabetes mellitus: Secondary | ICD-10-CM

## 2015-08-10 DIAGNOSIS — O09523 Supervision of elderly multigravida, third trimester: Secondary | ICD-10-CM

## 2015-08-10 DIAGNOSIS — Z3493 Encounter for supervision of normal pregnancy, unspecified, third trimester: Secondary | ICD-10-CM

## 2015-08-10 DIAGNOSIS — Z369 Encounter for antenatal screening, unspecified: Secondary | ICD-10-CM

## 2015-08-10 DIAGNOSIS — Z1389 Encounter for screening for other disorder: Secondary | ICD-10-CM

## 2015-08-10 LAB — POCT URINALYSIS DIPSTICK
Blood, UA: NEGATIVE
Glucose, UA: NEGATIVE
KETONES UA: NEGATIVE
LEUKOCYTES UA: NEGATIVE
Nitrite, UA: NEGATIVE
Protein, UA: NEGATIVE

## 2015-08-10 NOTE — Progress Notes (Signed)
[id number] 7465w1d Estimated Date of Delivery: 11/01/15  Blood pressure 104/60, pulse 80, weight 196 lb 8 oz (89.132 kg), last menstrual period 01/25/2015.   BP weight and urine results all reviewed and noted.  Please refer to the obstetrical flow sheet for the fundal height and fetal heart rate documentation:  Patient reports good fetal movement, denies any bleeding and no rupture of membranes symptoms or regular contractions. Patient is without complaints. All questions were answered.  Orders Placed This Encounter  Procedures  . POCT urinalysis dipstick    Plan:  Continued routine obstetrical care, PN2 today  Return in about 4 weeks (around 09/07/2015) for LROB, US:BPP, US:OB F/U:.EFW d/t AMA

## 2015-08-10 NOTE — Patient Instructions (Signed)

## 2015-08-11 LAB — GLUCOSE TOLERANCE, 2 HOURS W/ 1HR
GLUCOSE, 1 HOUR: 119 mg/dL (ref 65–179)
GLUCOSE, FASTING: 77 mg/dL (ref 65–91)
Glucose, 2 hour: 139 mg/dL (ref 65–152)

## 2015-08-11 LAB — CBC
HEMATOCRIT: 33 % — AB (ref 34.0–46.6)
Hemoglobin: 11.1 g/dL (ref 11.1–15.9)
MCH: 29.7 pg (ref 26.6–33.0)
MCHC: 33.6 g/dL (ref 31.5–35.7)
MCV: 88 fL (ref 79–97)
PLATELETS: 259 10*3/uL (ref 150–379)
RBC: 3.74 x10E6/uL — AB (ref 3.77–5.28)
RDW: 13.3 % (ref 12.3–15.4)
WBC: 12.8 10*3/uL — ABNORMAL HIGH (ref 3.4–10.8)

## 2015-08-11 LAB — ANTIBODY SCREEN: Antibody Screen: NEGATIVE

## 2015-08-11 LAB — RPR: RPR Ser Ql: NONREACTIVE

## 2015-08-11 LAB — HIV ANTIBODY (ROUTINE TESTING W REFLEX): HIV Screen 4th Generation wRfx: NONREACTIVE

## 2015-09-07 ENCOUNTER — Encounter: Payer: Self-pay | Admitting: Women's Health

## 2015-09-07 ENCOUNTER — Ambulatory Visit (INDEPENDENT_AMBULATORY_CARE_PROVIDER_SITE_OTHER): Payer: 59

## 2015-09-07 ENCOUNTER — Ambulatory Visit (INDEPENDENT_AMBULATORY_CARE_PROVIDER_SITE_OTHER): Payer: 59 | Admitting: Women's Health

## 2015-09-07 VITALS — BP 116/72 | HR 72 | Wt 197.0 lb

## 2015-09-07 DIAGNOSIS — Z3A32 32 weeks gestation of pregnancy: Secondary | ICD-10-CM

## 2015-09-07 DIAGNOSIS — O09523 Supervision of elderly multigravida, third trimester: Secondary | ICD-10-CM | POA: Diagnosis not present

## 2015-09-07 DIAGNOSIS — Z1389 Encounter for screening for other disorder: Secondary | ICD-10-CM

## 2015-09-07 DIAGNOSIS — Z3483 Encounter for supervision of other normal pregnancy, third trimester: Secondary | ICD-10-CM

## 2015-09-07 DIAGNOSIS — Z331 Pregnant state, incidental: Secondary | ICD-10-CM

## 2015-09-07 DIAGNOSIS — Z3493 Encounter for supervision of normal pregnancy, unspecified, third trimester: Secondary | ICD-10-CM

## 2015-09-07 LAB — POCT URINALYSIS DIPSTICK
Blood, UA: NEGATIVE
LEUKOCYTES UA: NEGATIVE
Nitrite, UA: NEGATIVE
PROTEIN UA: NEGATIVE

## 2015-09-07 NOTE — Progress Notes (Signed)
Low-risk OB appointment [id number] [redacted]w[redacted]d Estimated Date of Delivery: 11/01/15 BP 116/72 mmHg  Pulse 72  Wt 197 lb (89.359 kg)  LMP 01/25/2015 (Exact Date)  BP, weight, and urine reviewed.  Refer to obstetrical flow sheet for FH & FHR.  Reports good fm.  Denies regular uc's, lof, vb, or uti s/s. No complaints. Very concerned about shoulder dystocia occuring again. Will get efw ~36-37wks.  Reviewed today's normal u/s for ama- bpp 8/8, efw 64%, afi 15cm. Discussed ptl s/s, fkc. Recommended Tdap at HD/PCP per CDC guidelines.  Plan:  Continue routine obstetrical care  F/U in 2wks for OB appointment

## 2015-09-07 NOTE — Progress Notes (Signed)
[place] 32+1wks,cephalic,ant pl gr 1,afi 15 cm,BPP 8/8,bilat adnexa's wnl,efw 2165 64%

## 2015-09-07 NOTE — Patient Instructions (Addendum)
Call the office ([phone]) or go to [facility] if:  You begin to have strong, frequent contractions  Your water breaks.  Sometimes it is a big gush of fluid, sometimes it is just a trickle that keeps getting your panties wet or running down your legs  You have vaginal bleeding.  It is normal to have a small amount of spotting if your cervix was checked.   You don't feel your baby moving like normal.  If you don't, get you something to eat and drink and lay down and focus on feeling your baby move.  You should feel at least 10 movements in 2 hours.  If you don't, you should call the office or go to [facility].    Tdap Vaccine  It is recommended that you get the Tdap vaccine during the third trimester of EACH pregnancy to help protect your baby from getting pertussis (whooping cough)  27-36 weeks is the BEST time to do this so that you can pass the protection on to your baby. During pregnancy is better than after pregnancy, but if you are unable to get it during pregnancy it will be offered at the hospital.   You can get this vaccine at the health department or your family doctor  Everyone who will be around your baby should also be up-to-date on their vaccines. Adults (who are not pregnant) only need 1 dose of Tdap during adulthood.      Preterm Labor Information Preterm labor is when labor starts at less than 37 weeks of pregnancy. The normal length of a pregnancy is 39 to 41 weeks. CAUSES Often, there is no identifiable underlying cause as to why a woman goes into preterm labor. One of the most common known causes of preterm labor is infection. Infections of the uterus, cervix, vagina, amniotic sac, bladder, kidney, or even the lungs (pneumonia) can cause labor to start. Other suspected causes of preterm labor include:  8. Urogenital infections, such as yeast infections and bacterial vaginosis.  9. Uterine abnormalities (uterine shape, uterine septum, fibroids, or bleeding  from the placenta).  10. A cervix that has been operated on (it may fail to stay closed).  11. Malformations in the fetus.  12. Multiple gestations (twins, triplets, and so on).  13. Breakage of the amniotic sac.  RISK FACTORS 2. Having a previous history of preterm labor.  3. Having premature rupture of membranes (PROM).  4. Having a placenta that covers the opening of the cervix (placenta previa).  5. Having a placenta that separates from the uterus (placental abruption).  6. Having a cervix that is too weak to hold the fetus in the uterus (incompetent cervix).  7. Having too much fluid in the amniotic sac (polyhydramnios).  8. Taking illegal drugs or smoking while pregnant.  9. Not gaining enough weight while pregnant.  10. Being younger than 18 and older than 37 years old.  11. Having a low socioeconomic status.  12. Being African American. SYMPTOMS Signs and symptoms of preterm labor include:   Menstrual-like cramps, abdominal pain, or back pain.  Uterine contractions that are regular, as frequent as six in an hour, regardless of their intensity (may be mild or painful).  Contractions that start on the top of the uterus and spread down to the lower abdomen and back.   A sense of increased pelvic pressure.   A watery or bloody mucus discharge that comes from the vagina.  TREATMENT Depending on the length of the pregnancy and   other circumstances, your health care provider may suggest bed rest. If necessary, there are medicines that can be given to stop contractions and to mature the fetal lungs. If labor happens before 34 weeks of pregnancy, a prolonged hospital stay may be recommended. Treatment depends on the condition of both you and the fetus.  WHAT SHOULD YOU DO IF YOU THINK YOU ARE IN PRETERM LABOR? Call your health care provider right away. You will need to go to the hospital to get checked immediately. HOW CAN YOU PREVENT PRETERM LABOR IN FUTURE  PREGNANCIES? You should:   Stop smoking if you smoke.  Maintain healthy weight gain and avoid chemicals and drugs that are not necessary.  Be watchful for any type of infection.  Inform your health care provider if you have a known history of preterm labor.   This information is not intended to replace advice given to you by your health care provider. Make sure you discuss any questions you have with your health care provider.   Document Released: 10/13/2003 Document Revised: 03/25/2013 Document Reviewed: 08/25/2012 Elsevier Interactive Patient Education 2016 [employer]  

## 2015-09-21 ENCOUNTER — Encounter: Payer: Self-pay | Admitting: Advanced Practice Midwife

## 2015-09-21 ENCOUNTER — Ambulatory Visit (INDEPENDENT_AMBULATORY_CARE_PROVIDER_SITE_OTHER): Payer: 59 | Admitting: Advanced Practice Midwife

## 2015-09-21 VITALS — BP 114/68 | HR 72 | Wt 199.0 lb

## 2015-09-21 DIAGNOSIS — Z3493 Encounter for supervision of normal pregnancy, unspecified, third trimester: Secondary | ICD-10-CM

## 2015-09-21 DIAGNOSIS — Z1389 Encounter for screening for other disorder: Secondary | ICD-10-CM

## 2015-09-21 DIAGNOSIS — O09293 Supervision of pregnancy with other poor reproductive or obstetric history, third trimester: Secondary | ICD-10-CM

## 2015-09-21 DIAGNOSIS — Z331 Pregnant state, incidental: Secondary | ICD-10-CM

## 2015-09-21 DIAGNOSIS — O09523 Supervision of elderly multigravida, third trimester: Secondary | ICD-10-CM

## 2015-09-21 LAB — POCT URINALYSIS DIPSTICK
GLUCOSE UA: NEGATIVE
Ketones, UA: NEGATIVE
Leukocytes, UA: NEGATIVE
NITRITE UA: NEGATIVE
PROTEIN UA: NEGATIVE
RBC UA: NEGATIVE

## 2015-09-21 NOTE — Progress Notes (Signed)
[id number] [redacted]w[redacted]d Estimated Date of Delivery: 11/01/15  Blood pressure 114/68, pulse 72, weight 199 lb (90.266 kg), last menstrual period 01/25/2015.   BP weight and urine results all reviewed and noted.  Please refer to the obstetrical flow sheet for the fundal height and fetal heart rate documentation:  Patient reports good fetal movement, denies any bleeding and no rupture of membranes symptoms or regular contractions. Patient is without complaints other than normal pregnancy complaints               All questions were answered.  Orders Placed This Encounter  Procedures  . US OB Follow Up  . POCT urinalysis dipstick    Plan:  Continued routine obstetrical care,   Return in about 2 weeks (around 10/05/2015) for LROB, US:EFW.  (hx shoulder dystocia)

## 2015-09-28 ENCOUNTER — Encounter: Payer: Self-pay | Admitting: Advanced Practice Midwife

## 2015-09-28 ENCOUNTER — Telehealth: Payer: Self-pay | Admitting: *Deleted

## 2015-09-28 ENCOUNTER — Ambulatory Visit (INDEPENDENT_AMBULATORY_CARE_PROVIDER_SITE_OTHER): Payer: 59 | Admitting: Advanced Practice Midwife

## 2015-09-28 VITALS — BP 118/76 | HR 88 | Wt 200.0 lb

## 2015-09-28 DIAGNOSIS — Z1389 Encounter for screening for other disorder: Secondary | ICD-10-CM

## 2015-09-28 DIAGNOSIS — Z3A36 36 weeks gestation of pregnancy: Secondary | ICD-10-CM

## 2015-09-28 DIAGNOSIS — Z331 Pregnant state, incidental: Secondary | ICD-10-CM

## 2015-09-28 DIAGNOSIS — Z3493 Encounter for supervision of normal pregnancy, unspecified, third trimester: Secondary | ICD-10-CM

## 2015-09-28 LAB — POCT URINALYSIS DIPSTICK
GLUCOSE UA: NEGATIVE
KETONES UA: NEGATIVE
LEUKOCYTES UA: NEGATIVE
Nitrite, UA: NEGATIVE
Protein, UA: NEGATIVE
RBC UA: NEGATIVE

## 2015-09-28 NOTE — Telephone Encounter (Signed)
Pt called stating that she lost her mucus plug a couple weeks ago and is now having a lot of pressure. Pt states that she is also having lower back pain. Pt denies any bleeding or gush of fluid. Pt states that she is having braxton hicks contractions and that she feels the baby really low. Pt states that she has good baby movement but it has decreased some. Pt given an appointment for today to be seen.

## 2015-09-28 NOTE — Progress Notes (Signed)
WORK IN FOR LBP AND PRESSURE   [id number] [redacted]w[redacted]d Estimated Date of Delivery: 11/01/15  Blood pressure 118/76, pulse 88, weight 200 lb (90.719 kg), last menstrual period 01/25/2015.   BP weight and urine results all reviewed and noted.  Please refer to the obstetrical flow sheet for the fundal height and fetal heart rate documentation:  Patient reports good fetal movement, denies any bleeding and no rupture of membranes symptoms or regular contractions. Had 3 that were 10 minutes apart,  Patient is without complaints. All questions were answered.  Orders Placed This Encounter  Procedures  . POCT urinalysis dipstick    Plan:  Continued routine obstetrical care,   Return for As scheduled.

## 2015-09-28 NOTE — Patient Instructions (Signed)
AM I IN LABOR? What is labor? Labor is the work that your body does to birth your baby. Your uterus (the womb) contracts. Your cervix (the mouth of the uterus) opens. You will push your baby out into the world.  What do contractions (labor pains) feel like? When they first start, contractions usually feel like cramps during your period. Sometimes you feel pain in your back. Most often, contractions feel like muscles pulling painfully in your lower belly. At first, the contractions will probably be 15 to 20 minutes apart. They will not feel too painful. As labor goes on, the contractions get stronger, closer together, and more painful.  How do I time the contractions? Time your contractions by counting the number of minutes from the start of one contraction to the start of the next contraction.  What should I do when the contractions start? If it is night and you can sleep, sleep. If it happens during the day, here are some things you can do to take care of yourself at home: ? Walk. If the pains you are having are real labor, walking will make the contractions come faster and harder. If the contractions are not going to continue and be real labor, walking will make the contractions slow down. ? Take a shower or bath. This will help you relax. ? Eat. Labor is a big event. It takes a lot of energy. ? Drink water. Not drinking enough water can cause false labor (contractions that hurt but do not open your cervix). If this is true labor, drinking water will help you have strength to get through your labor. ? Take a nap. Get all the rest you can. ? Get a massage. If your labor is in your back, a strong massage on your lower back may feel very good. Getting a foot massage is always good. ? Don't panic. You can do this. Your body was made for this. You are strong!  When should I go to the hospital or call my health care provider? ? Your contractions have been 5 minutes apart or less for at least 1  hour. ? If several contractions are so painful you cannot walk or talk during one. ? Your bag of waters breaks. (You may have a big gush of water or just water that runs down your legs when you walk.)  Are there other reasons to call my health care provider? Yes, you should call your health care provider or go to the hospital if you start to bleed like you are having a period- blood that soaks your underwear or runs down your legs, if you have sudden severe pain, if your baby has not moved for several hours, or if you are leaking green fluid. The rule is as follows: If you are very concerned about something, call.   . Back Pain in Pregnancy Back pain during pregnancy is common. It happens in about half of all pregnancies. It is important for you and your baby that you remain active during your pregnancy.If you feel that back pain is not allowing you to remain active or sleep well, it is time to see your caregiver. Back pain may be caused by several factors related to changes during your pregnancy.Fortunately, unless you had trouble with your back before your pregnancy, the pain is likely to get better after you deliver. Low back pain usually occurs between the fifth and seventh months of pregnancy. It can, however, happen in the first couple months. Factors that increase the   risk of back problems include:   Previous back problems.  Injury to your back.  Having twins or multiple births.  A chronic cough.  Stress.  Job-related repetitive motions.  Muscle or spinal disease in the back.  Family history of back problems, ruptured (herniated) discs, or osteoporosis.  Depression, anxiety, and panic attacks. CAUSES   When you are pregnant, your body produces a hormone called relaxin. This hormonemakes the ligaments connecting the low back and pubic bones more flexible. This flexibility allows the baby to be delivered more easily. When your ligaments are loose, your muscles need to work  harder to support your back. Soreness in your back can come from tired muscles. Soreness can also come from back tissues that are irritated since they are receiving less support.  As the baby grows, it puts pressure on the nerves and blood vessels in your pelvis. This can cause back pain.  As the baby grows and gets heavier during pregnancy, the uterus pushes the stomach muscles forward and changes your center of gravity. This makes your back muscles work harder to maintain good posture. SYMPTOMS  Lumbar pain during pregnancy Lumbar pain during pregnancy usually occurs at or above the waist in the center of the back. There may be pain and numbness that radiates into your leg or foot. This is similar to low back pain experienced by non-pregnant women. It usually increases with sitting for long periods of time, standing, or repetitive lifting. Tenderness may also be present in the muscles along your upper back. Posterior pelvic pain during pregnancy Pain in the back of the pelvis is more common than lumbar pain in pregnancy. It is a deep pain felt in your side at the waistline, or across the tailbone (sacrum), or in both places. You may have pain on one or both sides. This pain can also go into the buttocks and backs of the upper thighs. Pubic and groin pain may also be present. The pain does not quickly resolve with rest, and morning stiffness may also be present. Pelvic pain during pregnancy can be brought on by most activities. A high level of fitness before and during pregnancy may or may not prevent this problem. Labor pain is usually 1 to 2 minutes apart, lasts for about 1 minute, and involves a bearing down feeling or pressure in your pelvis. However, if you are at term with the pregnancy, constant low back pain can be the beginning of early labor, and you should be aware of this. DIAGNOSIS  X-rays of the back should not be done during the first 12 to 14 weeks of the pregnancy and only when  absolutely necessary during the rest of the pregnancy. MRIs do not give off radiation and are safe during pregnancy. MRIs also should only be done when absolutely necessary. HOME CARE INSTRUCTIONS  Exercise as directed by your caregiver. Exercise is the most effective way to prevent or manage back pain. If you have a back problem, it is especially important to avoid sports that require sudden body movements. Swimming and walking are great activities.  Do not stand in one place for long periods of time.  Do not wear high heels.  Sit in chairs with good posture. Use a pillow on your lower back if necessary. Make sure your head rests over your shoulders and is not hanging forward.  Try sleeping on your side, preferably the left side, with a pillow or two between your legs. If you are sore after a night's rest,   your bedmay betoo soft.Try placing a board between your mattress and box spring.  Listen to your body when lifting.If you are experiencing pain, ask for help or try bending yourknees more so you can use your leg muscles rather than your back muscles. Squat down when picking up something from the floor. Do not bend over.  Eat a healthy diet. Try to gain weight within your caregiver's recommendations.  Use heat or cold packs 3 to 4 times a day for 15 minutes to help with the pain.  Only take over-the-counter or prescription medicines for pain, discomfort, or fever as directed by your caregiver. Sudden (acute) back pain  Use bed rest for only the most extreme, acute episodes of back pain. Prolonged bed rest over 48 hours will aggravate your condition.  Ice is very effective for acute conditions.  Put ice in a plastic bag.  Place a towel between your skin and the bag.  Leave the ice on for 10 to 20 minutes every 2 hours, or as needed.  Using heat packs for 30 minutes prior to activities is also helpful. Continued back pain See your caregiver if you have continued problems. Your  caregiver can help or refer you for appropriate physical therapy. With conditioning, most back problems can be avoided. Sometimes, a more serious issue may be the cause of back pain. You should be seen right away if new problems seem to be developing. Your caregiver may recommend:  A maternity girdle.  An elastic sling.  A back brace.  A massage therapist or acupuncture. SEEK MEDICAL CARE IF:   You are not able to do most of your daily activities, even when taking the pain medicine you were given.  You need a referral to a physical therapist or chiropractor.  You want to try acupuncture. SEEK IMMEDIATE MEDICAL CARE IF:  You develop numbness, tingling, weakness, or problems with the use of your arms or legs.  You develop severe back pain that is no longer relieved with medicines.  You have a sudden change in bowel or bladder control.  You have increasing pain in other areas of the body.  You develop shortness of breath, dizziness, or fainting.  You develop nausea, vomiting, or sweating.  You have back pain which is similar to labor pains.  You have back pain along with your water breaking or vaginal bleeding.  You have back pain or numbness that travels down your leg.  Your back pain developed after you fell.  You develop pain on one side of your back. You may have a kidney stone.  You see blood in your urine. You may have a bladder infection or kidney stone.  You have back pain with blisters. You may have shingles. Back pain is fairly common during pregnancy but should not be accepted as just part of the process. Back pain should always be treated as soon as possible. This will make your pregnancy as pleasant as possible.   This information is not intended to replace advice given to you by your health care provider. Make sure you discuss any questions you have with your health care provider.   Document Released: 10/31/2005 Document Revised: 10/15/2011 Document Reviewed:  12/12/2010 Elsevier Interactive Patient Education 2016 [employer]  

## 2015-10-05 ENCOUNTER — Ambulatory Visit (INDEPENDENT_AMBULATORY_CARE_PROVIDER_SITE_OTHER): Payer: 59 | Admitting: Advanced Practice Midwife

## 2015-10-05 ENCOUNTER — Ambulatory Visit (INDEPENDENT_AMBULATORY_CARE_PROVIDER_SITE_OTHER): Payer: 59

## 2015-10-05 VITALS — BP 116/80 | HR 86 | Wt 200.0 lb

## 2015-10-05 DIAGNOSIS — O09523 Supervision of elderly multigravida, third trimester: Secondary | ICD-10-CM | POA: Diagnosis not present

## 2015-10-05 DIAGNOSIS — Z3492 Encounter for supervision of normal pregnancy, unspecified, second trimester: Secondary | ICD-10-CM

## 2015-10-05 DIAGNOSIS — Z3A37 37 weeks gestation of pregnancy: Secondary | ICD-10-CM | POA: Diagnosis not present

## 2015-10-05 DIAGNOSIS — Z1389 Encounter for screening for other disorder: Secondary | ICD-10-CM

## 2015-10-05 DIAGNOSIS — Z331 Pregnant state, incidental: Secondary | ICD-10-CM

## 2015-10-05 DIAGNOSIS — O09293 Supervision of pregnancy with other poor reproductive or obstetric history, third trimester: Secondary | ICD-10-CM

## 2015-10-05 DIAGNOSIS — Z3483 Encounter for supervision of other normal pregnancy, third trimester: Secondary | ICD-10-CM

## 2015-10-05 LAB — POCT URINALYSIS DIPSTICK
Blood, UA: NEGATIVE
GLUCOSE UA: NEGATIVE
KETONES UA: NEGATIVE
Leukocytes, UA: NEGATIVE
Nitrite, UA: NEGATIVE
Protein, UA: NEGATIVE

## 2015-10-05 NOTE — Progress Notes (Signed)
[place] 36+1wks,cephalic,ant pl gr 1,afi 8 cm,fhr 138 bpm,bilat adnexa's wnl,efw 3205 g 73%

## 2015-10-05 NOTE — Progress Notes (Signed)
[id number] [redacted]w[redacted]d Estimated Date of Delivery: 11/01/15  Blood pressure 116/80, pulse 86, weight 200 lb (90.719 kg), last menstrual period 01/25/2015.   BP weight and urine results all reviewed and noted.  Please refer to the obstetrical flow sheet for the fundal height and fetal heart rate documentation:US 36+1wks,cephalic,ant pl gr 1,afi 8 cm,fhr 138 bpm,bilat adnexa's wnl,efw 3205 g 73%  Patient reports good fetal movement, denies any bleeding and no rupture of membranes symptoms or regular contractions. Patient is without complaints. All questions were answered.  Orders Placed This Encounter  Procedures  . POCT urinalysis dipstick    Plan:  Continued routine obstetrical care,   Return in about 1 week (around 10/12/2015) for LROB.

## 2015-10-12 ENCOUNTER — Ambulatory Visit (INDEPENDENT_AMBULATORY_CARE_PROVIDER_SITE_OTHER): Payer: 59 | Admitting: Advanced Practice Midwife

## 2015-10-12 VITALS — BP 118/64 | HR 110 | Wt 202.5 lb

## 2015-10-12 DIAGNOSIS — Z369 Encounter for antenatal screening, unspecified: Secondary | ICD-10-CM

## 2015-10-12 DIAGNOSIS — Z3685 Encounter for antenatal screening for Streptococcus B: Secondary | ICD-10-CM

## 2015-10-12 DIAGNOSIS — Z331 Pregnant state, incidental: Secondary | ICD-10-CM

## 2015-10-12 DIAGNOSIS — Z1389 Encounter for screening for other disorder: Secondary | ICD-10-CM

## 2015-10-12 DIAGNOSIS — Z3492 Encounter for supervision of normal pregnancy, unspecified, second trimester: Secondary | ICD-10-CM

## 2015-10-12 LAB — POCT URINALYSIS DIPSTICK
Blood, UA: NEGATIVE
Glucose, UA: NEGATIVE
KETONES UA: NEGATIVE
NITRITE UA: NEGATIVE

## 2015-10-12 NOTE — Progress Notes (Signed)
[id number] 3535w1d Estimated Date of Delivery: 11/01/15  Blood pressure 118/64, pulse 110, weight 202 lb 8 oz (91.853 kg), last menstrual period 01/25/2015.   BP weight and urine results all reviewed and noted.  Please refer to the obstetrical flow sheet for the fundal height and fetal heart rate documentation:  Patient reports good fetal movement, denies any bleeding and no rupture of membranes symptoms or regular contractions. Patient is without complaints. All questions were answered.  Orders Placed This Encounter  Procedures  . GC/Chlamydia Probe Amp  . Strep Gp B NAA  . POCT urinalysis dipstick    Plan:  Continued routine obstetrical care, GBS/GC/CHL today   Return in about 1 week (around 10/19/2015) for LROB.

## 2015-10-13 LAB — GC/CHLAMYDIA PROBE AMP
CHLAMYDIA, DNA PROBE: NEGATIVE
NEISSERIA GONORRHOEAE BY PCR: NEGATIVE

## 2015-10-14 LAB — STREP GP B NAA: STREP GROUP B AG: NEGATIVE

## 2015-10-19 ENCOUNTER — Ambulatory Visit (INDEPENDENT_AMBULATORY_CARE_PROVIDER_SITE_OTHER): Payer: 59 | Admitting: Obstetrics and Gynecology

## 2015-10-19 ENCOUNTER — Encounter: Payer: Self-pay | Admitting: Obstetrics and Gynecology

## 2015-10-19 VITALS — BP 120/78 | HR 90 | Wt 204.5 lb

## 2015-10-19 DIAGNOSIS — O09293 Supervision of pregnancy with other poor reproductive or obstetric history, third trimester: Secondary | ICD-10-CM

## 2015-10-19 DIAGNOSIS — Z3A38 38 weeks gestation of pregnancy: Secondary | ICD-10-CM | POA: Diagnosis not present

## 2015-10-19 DIAGNOSIS — Z3493 Encounter for supervision of normal pregnancy, unspecified, third trimester: Secondary | ICD-10-CM

## 2015-10-19 DIAGNOSIS — Z331 Pregnant state, incidental: Secondary | ICD-10-CM

## 2015-10-19 DIAGNOSIS — Z3483 Encounter for supervision of other normal pregnancy, third trimester: Secondary | ICD-10-CM

## 2015-10-19 DIAGNOSIS — Z1389 Encounter for screening for other disorder: Secondary | ICD-10-CM

## 2015-10-19 LAB — POCT URINALYSIS DIPSTICK
Glucose, UA: NEGATIVE
Ketones, UA: NEGATIVE
Leukocytes, UA: NEGATIVE
Nitrite, UA: NEGATIVE
RBC UA: NEGATIVE

## 2015-10-19 NOTE — Progress Notes (Signed)
Pt denies any problems or concerns at this time.  

## 2015-10-19 NOTE — Progress Notes (Signed)
Patient ID: Patricia Navarro, female   DOB: [birth year], 35 y.o.   MRN: [id number]  [id number] 2936w1d Estimated Date of Delivery: 11/01/15  Blood pressure 120/78, pulse 90, weight 204 lb 8 oz (92.761 kg), last menstrual period 01/25/2015.   refer to the ob flow sheet for FH and FHR, also BP, Wt, Urine results:notable for trace protein  Patient reports  + good fetal movement, denies any bleeding and no rupture of membranes symptoms or regular contractions. Patient complaints: none. Pt has h/o 30s shoulder dystocia with 9lb delivery. She states she is considering depo or PO oral contraceptives after delivery. Pt plans on breastfeeding.   FHR- 127 bpm  FH- 37 cm  Cervix- 2.5 cm dilated; 20% effaced, -2  Questions were answered. Assessment: LROB [id number] @ 3136w1d  Group B strep on 10/12/15 negative  GC/CHL on 10/12/15 negative   Plan:  Continued routine obstetrical care  F/u in 1 week for routine prenatal care    By signing my name below, I, Doreatha MartinEva Mathews, attest that this documentation has been prepared under the direction and in the presence of Tilda BurrowJohn Flay Ghosh V, MD. Electronically Signed: Doreatha MartinEva Mathews, ED Scribe. 10/19/2015. 2:19 PM.  I personally performed the services described in this documentation, which was SCRIBED in my presence. The recorded information has been reviewed and considered accurate. It has been edited as necessary during review. Tilda BurrowFERGUSON,Skanda Worlds V, MD

## 2015-10-26 ENCOUNTER — Inpatient Hospital Stay (HOSPITAL_COMMUNITY)
Admission: AD | Admit: 2015-10-26 | Discharge: 2015-10-28 | DRG: 775 | Disposition: A | Discharge status: Home / Self Care | Payer: 59 | Source: Ambulatory Visit | Attending: Obstetrics and Gynecology | Admitting: Obstetrics and Gynecology

## 2015-10-26 ENCOUNTER — Encounter (HOSPITAL_COMMUNITY): Payer: Self-pay | Admitting: Obstetrics

## 2015-10-26 ENCOUNTER — Encounter: Payer: Self-pay | Admitting: Obstetrics and Gynecology

## 2015-10-26 ENCOUNTER — Ambulatory Visit (INDEPENDENT_AMBULATORY_CARE_PROVIDER_SITE_OTHER): Payer: 59 | Admitting: Obstetrics and Gynecology

## 2015-10-26 VITALS — BP 160/104 | HR 110 | Wt 204.5 lb

## 2015-10-26 DIAGNOSIS — Z825 Family history of asthma and other chronic lower respiratory diseases: Secondary | ICD-10-CM

## 2015-10-26 DIAGNOSIS — Z8249 Family history of ischemic heart disease and other diseases of the circulatory system: Secondary | ICD-10-CM

## 2015-10-26 DIAGNOSIS — Z3A39 39 weeks gestation of pregnancy: Secondary | ICD-10-CM

## 2015-10-26 DIAGNOSIS — Z1389 Encounter for screening for other disorder: Secondary | ICD-10-CM

## 2015-10-26 DIAGNOSIS — O134 Gestational [pregnancy-induced] hypertension without significant proteinuria, complicating childbirth: Secondary | ICD-10-CM | POA: Diagnosis present

## 2015-10-26 DIAGNOSIS — Z3493 Encounter for supervision of normal pregnancy, unspecified, third trimester: Secondary | ICD-10-CM

## 2015-10-26 DIAGNOSIS — Z331 Pregnant state, incidental: Secondary | ICD-10-CM

## 2015-10-26 DIAGNOSIS — R030 Elevated blood-pressure reading, without diagnosis of hypertension: Secondary | ICD-10-CM | POA: Diagnosis present

## 2015-10-26 DIAGNOSIS — O133 Gestational [pregnancy-induced] hypertension without significant proteinuria, third trimester: Secondary | ICD-10-CM | POA: Diagnosis present

## 2015-10-26 LAB — POCT URINALYSIS DIPSTICK
Blood, UA: NEGATIVE
Glucose, UA: NEGATIVE
KETONES UA: NEGATIVE
LEUKOCYTES UA: NEGATIVE
Nitrite, UA: NEGATIVE

## 2015-10-26 LAB — PROTEIN / CREATININE RATIO, URINE
CREATININE, URINE: 113 mg/dL
Protein Creatinine Ratio: 0.1 mg/mg{Cre} (ref 0.00–0.15)
TOTAL PROTEIN, URINE: 11 mg/dL

## 2015-10-26 LAB — CBC
HCT: 31.5 % — ABNORMAL LOW (ref 36.0–46.0)
HEMOGLOBIN: 10.3 g/dL — AB (ref 12.0–15.0)
MCH: 27.2 pg (ref 26.0–34.0)
MCHC: 32.7 g/dL (ref 30.0–36.0)
MCV: 83.3 fL (ref 78.0–100.0)
Platelets: 254 10*3/uL (ref 150–400)
RBC: 3.78 MIL/uL — AB (ref 3.87–5.11)
RDW: 13.8 % (ref 11.5–15.5)
WBC: 10.3 10*3/uL (ref 4.0–10.5)

## 2015-10-26 LAB — TYPE AND SCREEN
ABO/RH(D): O POS
Antibody Screen: NEGATIVE

## 2015-10-26 LAB — COMPREHENSIVE METABOLIC PANEL
ALT: 11 U/L — AB (ref 14–54)
AST: 17 U/L (ref 15–41)
Albumin: 3 g/dL — ABNORMAL LOW (ref 3.5–5.0)
Alkaline Phosphatase: 111 U/L (ref 38–126)
Anion gap: 8 (ref 5–15)
BILIRUBIN TOTAL: 0.5 mg/dL (ref 0.3–1.2)
BUN: 8 mg/dL (ref 6–20)
CALCIUM: 8.3 mg/dL — AB (ref 8.9–10.3)
CO2: 20 mmol/L — ABNORMAL LOW (ref 22–32)
CREATININE: 0.55 mg/dL (ref 0.44–1.00)
Chloride: 106 mmol/L (ref 101–111)
Glucose, Bld: 74 mg/dL (ref 65–99)
Potassium: 3.8 mmol/L (ref 3.5–5.1)
Sodium: 134 mmol/L — ABNORMAL LOW (ref 135–145)
TOTAL PROTEIN: 6.4 g/dL — AB (ref 6.5–8.1)

## 2015-10-26 MED ORDER — LACTATED RINGERS IV SOLN: 500.0000 mL | INTRAVENOUS | Status: DC | PRN

## 2015-10-26 MED ORDER — CITRIC ACID-SODIUM CITRATE 334-500 MG/5ML PO SOLN: 30.0000 mL | ORAL | Status: DC | PRN

## 2015-10-26 MED ORDER — OXYCODONE-ACETAMINOPHEN 5-325 MG PO TABS: 1.0000 | ORAL_TABLET | ORAL | Status: DC | PRN

## 2015-10-26 MED ORDER — ACETAMINOPHEN 325 MG PO TABS
650.0000 mg | ORAL_TABLET | ORAL | Status: DC | PRN
  Administered 2015-10-26: 650 mg via ORAL
  Filled 2015-10-26: qty 2

## 2015-10-26 MED ORDER — LIDOCAINE HCL (PF) 1 % IJ SOLN
30.0000 mL | INTRAMUSCULAR | Status: DC | PRN
  Filled 2015-10-26: qty 30

## 2015-10-26 MED ORDER — ALBUTEROL SULFATE (2.5 MG/3ML) 0.083% IN NEBU: 3.0000 mL | INHALATION_SOLUTION | Freq: Four times a day (QID) | RESPIRATORY_TRACT | Status: DC | PRN

## 2015-10-26 MED ORDER — FLEET ENEMA 7-19 GM/118ML RE ENEM: 1.0000 | ENEMA | RECTAL | Status: DC | PRN

## 2015-10-26 MED ORDER — OXYTOCIN 10 UNIT/ML IJ SOLN
2.5000 [IU]/h | INTRAVENOUS | Status: DC
  Filled 2015-10-26: qty 10

## 2015-10-26 MED ORDER — OXYTOCIN 10 UNIT/ML IJ SOLN: 1.0000 m[IU]/min | INTRAVENOUS | Status: DC

## 2015-10-26 MED ORDER — OXYTOCIN BOLUS FROM INFUSION
500.0000 mL | INTRAVENOUS | Status: DC
  Administered 2015-10-27: 500 mL via INTRAVENOUS

## 2015-10-26 MED ORDER — ONDANSETRON HCL 4 MG/2ML IJ SOLN
4.0000 mg | Freq: Four times a day (QID) | INTRAMUSCULAR | Status: DC | PRN
  Filled 2015-10-26: qty 2

## 2015-10-26 MED ORDER — LACTATED RINGERS IV SOLN
INTRAVENOUS | Status: DC
  Administered 2015-10-26 – 2015-10-27 (×2): via INTRAVENOUS

## 2015-10-26 MED ORDER — TERBUTALINE SULFATE 1 MG/ML IJ SOLN
0.2500 mg | Freq: Once | INTRAMUSCULAR | Status: DC | PRN
  Filled 2015-10-26: qty 1

## 2015-10-26 MED ORDER — OXYCODONE-ACETAMINOPHEN 5-325 MG PO TABS: 2.0000 | ORAL_TABLET | ORAL | Status: DC | PRN

## 2015-10-26 MED ORDER — OXYTOCIN 10 UNIT/ML IJ SOLN
1.0000 m[IU]/min | INTRAMUSCULAR | Status: DC
  Administered 2015-10-26: 2 m[IU]/min via INTRAVENOUS

## 2015-10-26 NOTE — Progress Notes (Signed)
Patient ID: Patricia Navarro, female   DOB: June 04, 1981, 35 y.o.   MRN: [id number] 1458w1d Estimated Date of Delivery: 11/01/15  Blood pressure 160/104, pulse 110, weight 204 lb 8 oz (92.761 kg), last menstrual period 01/25/2015.   refer to the ob flow sheet for FH and FHR, also BP, Wt, Urine results: negative  FHR: 166 Patient reports  + good fetal movement, denies any bleeding and no rupture of membranes symptoms or regular contractions. Patient complaints: Pt is tearful during exam and complains of pain to BLE with associated edema of the BLE and bilateral feet onset a few days ago.   Questions were answered. Assessment:  1. LROB [id number] @ 1358w1d, gestational HTN 2. 3cm dilated and 20 effaced   Plan:   [facility]. [facility] due to gestational HTN.   2. 4 week postpartum visit.  3. CNM notified.   By signing my name below, I, Marica Otterusrat Rahman, attest that this documentation has been prepared under the direction and in the presence of Christin BachJohn Jaivian Battaglini, MD. Electronically Signed: Marica OtterNusrat Rahman, ED Scribe. 10/26/2015. 3:00 PM.   I personally performed the services described in this documentation, which was SCRIBED in my presence. The recorded information has been reviewed and considered accurate. It has been edited as necessary during review. Tilda BurrowFERGUSON,Kannen Moxey V, MD

## 2015-10-26 NOTE — H&P (Signed)
Patricia Navarro is a 35 y.o. female presenting for induction of labor for elevated pressures at 39+1 wk. This [id number] Estimated Date of Delivery: 11/01/15 is admitted at 1816w1d [facility] with complaints of increasing pelvic discomfort and pressure, and was found to have elevated pressures to 160/104, with negative protein, and reflexed 2+. Cervix is favorable, at 3/20/-2, soft, so pt will be induced beginning with oxytocin.  History is notable for shoulder dystocia in past for 9 lb infant. This infant was 73%ile at 36 wk, considered smaller than 4000 gm at present. GBS negative,  OB History    Gravida Para Term Preterm AB TAB SAB Ectopic Multiple Living   4 3 3       3      Past Medical History  Diagnosis Date  . Hypoglycemia    Past Surgical History  Procedure Laterality Date  . Tonsillectomy    . Eye surgery     Family History: family history includes ADD / ADHD in her son; Asthma in her mother; Cancer in her maternal grandfather and paternal grandmother; Emphysema in her paternal grandfather; Heart attack in her maternal grandmother; Heart disease in her mother; Other in her paternal grandmother; Thyroid disease in her maternal grandmother and mother. Social History:  reports that she has never smoked. She has never used smokeless tobacco. She reports that she does not drink alcohol or use illicit drugs.   Prenatal Transfer Tool  Maternal Diabetes: No Genetic Screening: Normal Maternal Ultrasounds/Referrals: Normal  EFW  3200 at 36 wk, 73%ile Fetal Ultrasounds or other Referrals:  None Maternal Substance Abuse:  No Significant Maternal Medications:  None Significant Maternal Lab Results:  Lab values include: Group B Strep negative Other Comments:  None  ROS    Blood pressure 137/84, pulse 100, temperature 99.1 F (37.3 C), temperature source Oral, resp. rate 18, last menstrual period 01/25/2015. Maternal Exam:  Abdomen:  Fetal presentation: vertex  Introitus: Ferning test: not done.  Nitrazine test: not done.  Cervix: Cervix evaluated by digital exam.     Physical Exam  Constitutional: She is oriented to person, place, and time. She appears well-developed and well-nourished.  Eyes: Pupils are equal, round, and reactive to light.  Neck: Normal range of motion.  Cardiovascular: Normal rate.   Respiratory: Effort normal.  GI: Soft.  Genitourinary: Vagina normal.  Neurological: She is alert and oriented to person, place, and time.  Skin: Skin is warm and dry.  Psychiatric: She has a normal mood and affect. Her behavior is normal. Thought content normal.    Prenatal labs: ABO, Rh: O/Positive/-- (08/23 1701) Antibody: Negative (01/04 0838) Rubella: 3.98 (08/23 1701) RPR: Non Reactive (01/04 0838)  HBsAg: Negative (08/23 1701)  HIV: Non Reactive (01/04 0838)  GBS: Negative (03/08 1615)   Assessment/Plan: Pregnancy  39w 1d , gest htn. Plan  IOL per pitocin.    Coreon Simkins V 10/26/2015, 6:40 PM

## 2015-10-26 NOTE — Progress Notes (Signed)
Pt is very miserable, pt is crying and upset, states "I need to have this baby" Pt states that she is having a lot of swelling in her legs and feet and her legs hurt really bad.

## 2015-10-26 NOTE — Progress Notes (Signed)
Patricia Navarro is a 35 y.o. G4P3003 at 39104w1d by ultrasound admitted for induction of labor due to elevated pressures.  Subjective: Patient is doing well, sitting comfortably upright in bed conversing with her family and watching TV. Patient states she can feel her contractions, but they are not painful. No complaints at this time.  Objective: BP 111/76 mmHg  Pulse 110  Temp(Src) 99.1 F (37.3 C) (Oral)  Resp 18  Wt 92.761 kg (204 lb 8 oz)  LMP 01/25/2015 (Exact Date)  FHT:  FHR: 135 bpm, variability: moderate,  accelerations:  Present,  decelerations:  Absent UC:   regular, every 8 minutes SVE:   Dilation: 3 Effacement (%): 70 Station: -2 Exam by:: Irving BurtonEmily Rothermel RN   Labs: Lab Results  Component Value Date   WBC 10.3 10/26/2015   HGB 10.3* 10/26/2015   HCT 31.5* 10/26/2015   MCV 83.3 10/26/2015   PLT 254 10/26/2015    Assessment / Plan: Induction of labor due to elevated pressures,  progressing well on pitocin  Labor: Progressing on Pitocin, will continue to increase then AROM Fetal Wellbeing:  Category I Pain Control:  Epidural (planned) Anticipated MOD:  NSVD  Cephus ShellingAndrew P Fink PA-S 10/26/2015, 10:00 PM  I have seen and examined this patient and I agree with the above. Zachery DakinsSHAW, KIMBERLYCNM 12:45 AM 10/27/2015

## 2015-10-27 ENCOUNTER — Encounter (HOSPITAL_COMMUNITY): Payer: Self-pay | Admitting: *Deleted

## 2015-10-27 ENCOUNTER — Inpatient Hospital Stay (HOSPITAL_COMMUNITY): Payer: 59 | Admitting: Anesthesiology

## 2015-10-27 DIAGNOSIS — Z8249 Family history of ischemic heart disease and other diseases of the circulatory system: Secondary | ICD-10-CM

## 2015-10-27 DIAGNOSIS — Z3A39 39 weeks gestation of pregnancy: Secondary | ICD-10-CM

## 2015-10-27 DIAGNOSIS — O134 Gestational [pregnancy-induced] hypertension without significant proteinuria, complicating childbirth: Secondary | ICD-10-CM

## 2015-10-27 DIAGNOSIS — Z825 Family history of asthma and other chronic lower respiratory diseases: Secondary | ICD-10-CM

## 2015-10-27 LAB — RPR: RPR Ser Ql: NONREACTIVE

## 2015-10-27 LAB — CBC
HCT: 28.5 % — ABNORMAL LOW (ref 36.0–46.0)
Hemoglobin: 9.3 g/dL — ABNORMAL LOW (ref 12.0–15.0)
MCH: 27.4 pg (ref 26.0–34.0)
MCHC: 32.6 g/dL (ref 30.0–36.0)
MCV: 84.1 fL (ref 78.0–100.0)
PLATELETS: 223 10*3/uL (ref 150–400)
RBC: 3.39 MIL/uL — AB (ref 3.87–5.11)
RDW: 14 % (ref 11.5–15.5)
WBC: 10.3 10*3/uL (ref 4.0–10.5)

## 2015-10-27 MED ORDER — ONDANSETRON HCL 4 MG PO TABS: 4.0000 mg | ORAL_TABLET | ORAL | Status: DC | PRN

## 2015-10-27 MED ORDER — PHENYLEPHRINE 40 MCG/ML (10ML) SYRINGE FOR IV PUSH (FOR BLOOD PRESSURE SUPPORT)
PREFILLED_SYRINGE | INTRAVENOUS | Status: AC
  Filled 2015-10-27: qty 20

## 2015-10-27 MED ORDER — DIPHENHYDRAMINE HCL 50 MG/ML IJ SOLN: 12.5000 mg | INTRAMUSCULAR | Status: DC | PRN

## 2015-10-27 MED ORDER — BENZOCAINE-MENTHOL 20-0.5 % EX AERO: 1.0000 "application " | INHALATION_SPRAY | CUTANEOUS | Status: DC | PRN

## 2015-10-27 MED ORDER — IBUPROFEN 600 MG PO TABS
600.0000 mg | ORAL_TABLET | Freq: Four times a day (QID) | ORAL | Status: DC
  Administered 2015-10-27 – 2015-10-28 (×6): 600 mg via ORAL
  Filled 2015-10-27 (×6): qty 1

## 2015-10-27 MED ORDER — LANOLIN HYDROUS EX OINT: TOPICAL_OINTMENT | CUTANEOUS | Status: DC | PRN

## 2015-10-27 MED ORDER — SIMETHICONE 80 MG PO CHEW: 80.0000 mg | CHEWABLE_TABLET | ORAL | Status: DC | PRN

## 2015-10-27 MED ORDER — PHENYLEPHRINE 40 MCG/ML (10ML) SYRINGE FOR IV PUSH (FOR BLOOD PRESSURE SUPPORT)
80.0000 ug | PREFILLED_SYRINGE | INTRAVENOUS | Status: DC | PRN
  Filled 2015-10-27: qty 2

## 2015-10-27 MED ORDER — EPHEDRINE 5 MG/ML INJ
10.0000 mg | INTRAVENOUS | Status: DC | PRN
  Filled 2015-10-27: qty 2

## 2015-10-27 MED ORDER — FENTANYL 2.5 MCG/ML BUPIVACAINE 1/10 % EPIDURAL INFUSION (WH - ANES): 14.0000 mL/h | INTRAMUSCULAR | Status: DC | PRN

## 2015-10-27 MED ORDER — SENNOSIDES-DOCUSATE SODIUM 8.6-50 MG PO TABS
2.0000 | ORAL_TABLET | ORAL | Status: DC
  Administered 2015-10-27: 2 via ORAL
  Filled 2015-10-27: qty 2

## 2015-10-27 MED ORDER — PRENATAL MULTIVITAMIN CH
1.0000 | ORAL_TABLET | Freq: Every day | ORAL | Status: DC
  Administered 2015-10-27 – 2015-10-28 (×2): 1 via ORAL
  Filled 2015-10-27 (×2): qty 1

## 2015-10-27 MED ORDER — ACETAMINOPHEN 325 MG PO TABS
650.0000 mg | ORAL_TABLET | ORAL | Status: DC | PRN
  Administered 2015-10-27: 650 mg via ORAL
  Filled 2015-10-27: qty 2

## 2015-10-27 MED ORDER — WITCH HAZEL-GLYCERIN EX PADS: 1.0000 "application " | MEDICATED_PAD | CUTANEOUS | Status: DC | PRN

## 2015-10-27 MED ORDER — PHENYLEPHRINE 40 MCG/ML (10ML) SYRINGE FOR IV PUSH (FOR BLOOD PRESSURE SUPPORT)
80.0000 ug | PREFILLED_SYRINGE | INTRAVENOUS | Status: DC | PRN
  Administered 2015-10-27 (×2): 80 ug via INTRAVENOUS
  Filled 2015-10-27: qty 2

## 2015-10-27 MED ORDER — FENTANYL 2.5 MCG/ML BUPIVACAINE 1/10 % EPIDURAL INFUSION (WH - ANES)
INTRAMUSCULAR | Status: AC
  Administered 2015-10-27: 14 mL/h via EPIDURAL
  Filled 2015-10-27: qty 125

## 2015-10-27 MED ORDER — ZOLPIDEM TARTRATE 5 MG PO TABS: 5.0000 mg | ORAL_TABLET | Freq: Every evening | ORAL | Status: DC | PRN

## 2015-10-27 MED ORDER — LACTATED RINGERS IV SOLN: 500.0000 mL | Freq: Once | INTRAVENOUS | Status: DC

## 2015-10-27 MED ORDER — TETANUS-DIPHTH-ACELL PERTUSSIS 5-2.5-18.5 LF-MCG/0.5 IM SUSP
0.5000 mL | Freq: Once | INTRAMUSCULAR | Status: AC
  Administered 2015-10-28: 0.5 mL via INTRAMUSCULAR
  Filled 2015-10-27: qty 0.5

## 2015-10-27 MED ORDER — DIPHENHYDRAMINE HCL 25 MG PO CAPS: 25.0000 mg | ORAL_CAPSULE | Freq: Four times a day (QID) | ORAL | Status: DC | PRN

## 2015-10-27 MED ORDER — FENTANYL CITRATE (PF) 100 MCG/2ML IJ SOLN
100.0000 ug | INTRAMUSCULAR | Status: DC | PRN
  Administered 2015-10-27 (×2): 100 ug via INTRAVENOUS
  Filled 2015-10-27 (×2): qty 2

## 2015-10-27 MED ORDER — ONDANSETRON HCL 4 MG/2ML IJ SOLN: 4.0000 mg | INTRAMUSCULAR | Status: DC | PRN

## 2015-10-27 MED ORDER — LIDOCAINE HCL (PF) 1 % IJ SOLN
INTRAMUSCULAR | Status: DC | PRN
  Administered 2015-10-27: 6 mL via EPIDURAL
  Administered 2015-10-27: 4 mL

## 2015-10-27 MED ORDER — DIBUCAINE 1 % RE OINT: 1.0000 "application " | TOPICAL_OINTMENT | RECTAL | Status: DC | PRN

## 2015-10-27 NOTE — Progress Notes (Signed)
Patricia Navarro is a 35 y.o. [phone] at 2467w2d by LMP admitted for induction of labor due to Hypertension.  Subjective:   Objective: BP 130/72 mmHg  Pulse 93  Temp(Src) 98.5 F (36.9 C) (Oral)  Resp 18  Ht 5\' 5"  (1.651 m)  Wt 204 lb (92.534 kg)  BMI 33.95 kg/m2  LMP 01/25/2015 (Exact Date)   Temp:  [98.5 F (36.9 C)-99.1 F (37.3 C)] 98.5 F (36.9 C) (03/22 2230) Pulse Rate:  [77-110] 93 (03/23 0414) Resp:  [18] 18 (03/23 0300) BP: (109-160)/(54-104) 130/72 mmHg (03/23 0414) Weight:  [204 lb (92.534 kg)-204 lb 8 oz (92.761 kg)] 204 lb (92.534 kg) (03/23 0257)     FHT:  FHR: 145 bpm, variability: moderate,  accelerations:  Present,  decelerations:  Absent UC:   regular, every 3 minutes  SVE:   Dilation: 3.5 Effacement (%): 20 Station: -3 Exam by:: Dr. Emelda FearFerguson  Foley bulb placed. Labs: Lab Results  Component Value Date   WBC 10.3 10/26/2015   HGB 10.3* 10/26/2015   HCT 31.5* 10/26/2015   MCV 83.3 10/26/2015   PLT 254 10/26/2015    Assessment / Plan: Protracted latent phase foley bulb placed in cervix.now  Labor: adequate Preeclampsia:  Normal pressures now Fetal Wellbeing:  Category I Pain Control:  Labor support without medications I/D:  n/a Anticipated MOD:  NSVD  Khasir Woodrome V 10/27/2015, 4:16 AM

## 2015-10-27 NOTE — Anesthesia Procedure Notes (Signed)

## 2015-10-27 NOTE — Anesthesia Preprocedure Evaluation (Addendum)
Anesthesia Evaluation  Patient identified by MRN, date of birth, ID band Patient awake    Reviewed: Allergy & Precautions, H&P , Patient's Chart, lab work & pertinent test results  Airway Mallampati: II  TM Distance: >3 FB Neck ROM: full    Dental  (+) Teeth Intact   Pulmonary    breath sounds clear to auscultation       Cardiovascular  Rhythm:regular Rate:Normal     Neuro/Psych    GI/Hepatic   Endo/Other    Renal/GU      Musculoskeletal   Abdominal   Peds  Hematology  (+) anemia ,   Anesthesia Other Findings  Asthmatic bronchitits     Reproductive/Obstetrics (+) Pregnancy                            Anesthesia Physical Anesthesia Plan  ASA: II  Anesthesia Plan: Epidural   Post-op Pain Management:    Induction:   Airway Management Planned:   Additional Equipment:   Intra-op Plan:   Post-operative Plan:   Informed Consent: I have reviewed the patients History and Physical, chart, labs and discussed the procedure including the risks, benefits and alternatives for the proposed anesthesia with the patient or authorized representative who has indicated his/her understanding and acceptance.   Dental Advisory Given  Plan Discussed with:   Anesthesia Plan Comments: (Labs checked- platelets confirmed with RN in room. Fetal heart tracing, per RN, reported to be stable enough for sitting procedure. Discussed epidural, and patient consents to the procedure:  included risk of possible headache,backache, failed block, allergic reaction, and nerve injury. This patient was asked if she had any questions or concerns before the procedure started.)        Anesthesia Quick Evaluation

## 2015-10-27 NOTE — Progress Notes (Signed)
UR chart review completed.  

## 2015-10-27 NOTE — Anesthesia Postprocedure Evaluation (Signed)
Anesthesia Post Note  Patient: Patricia Navarro  Procedure(s) Performed: * No procedures listed *  Patient location during evaluation: Mother Baby Anesthesia Type: Epidural Level of consciousness: oriented, awake and alert and awake Pain management: pain level controlled Vital Signs Assessment: post-procedure vital signs reviewed and stable Respiratory status: spontaneous breathing Cardiovascular status: stable Postop Assessment: patient able to bend at knees, no signs of nausea or vomiting and adequate PO intake Anesthetic complications: no    Last Vitals:  Filed Vitals:   10/27/15 0816 10/27/15 0852  BP: 103/63 115/61  Pulse: 88 85  Temp:    Resp: 18 18    Last Pain:  Filed Vitals:   10/27/15 0914  PainSc: 5                  Christoffer Currier Hristova

## 2015-10-27 NOTE — Progress Notes (Signed)
Patricia Navarro is a 35 y.o. [phone] at 5259w2d by LMP admitted for induction of labor due to Hypertension.  Subjective: Foley bulb out,   Objective: BP 115/72 mmHg  Pulse 87  Temp(Src) 98.5 F (36.9 C) (Oral)  Resp 18  Ht 5\' 5"  (1.651 m)  Wt 204 lb (92.534 kg)  BMI 33.95 kg/m2  LMP 01/25/2015 (Exact Date)      FHT:  FHR: 145 bpm, variability: moderate,  accelerations:  Present,  decelerations:  Absent UC:   regular, every 3 minutes SVE:   Dilation: 3.5 Effacement (%): 20 Station: -3 Exam by:: Dr. Emelda FearFerguson   Labs: Lab Results  Component Value Date   WBC 10.3 10/26/2015   HGB 10.3* 10/26/2015   HCT 31.5* 10/26/2015   MCV 83.3 10/26/2015   PLT 254 10/26/2015    Assessment / Plan: Induction of labor due to gestational hypertension,  progressing well on pitocin  Labor: Progressing on Pitocin, will continue to increase then AROM arom done, clear Preeclampsia:   Fetal Wellbeing:  Category I Pain Control:  Epidural I/D:  n/a Anticipated MOD:  NSVD  Laia Wiley V 10/27/2015, 5:27 AM

## 2015-10-27 NOTE — Lactation Note (Signed)
This note was copied from a baby's chart. Lactation Consultation Note Initial visit at 10 hours of age.  Mom reports good feedings and now baby is asleep in crib.  Mom is experienced with older children doing well, last child breast fed for 3 years. [facility] resources given and discussed.  Encouraged to feed with early cues on demand.  Early newborn behavior discussed.  Hand expression reported with colostrum visible.  Mom to call for assist as needed.    Patient Name: Patricia Navarro Reason for consult: Initial assessment   Maternal Data Has patient been taught Hand Expression?: Yes Does the patient have breastfeeding experience prior to this delivery?: Yes  Feeding Feeding Type: Breast Fed Length of feed: 30 min  LATCH Score/Interventions Latch: Grasps breast easily, tongue down, lips flanged, rhythmical sucking.  Audible Swallowing: A few with stimulation  Type of Nipple: Everted at rest and after stimulation  Comfort (Breast/Nipple): Soft / non-tender     Hold (Positioning): Assistance needed to correctly position infant at breast and maintain latch. Intervention(s): Breastfeeding basics reviewed  LATCH Score: 8  Lactation Tools Discussed/Used WIC Program: No   Consult Status Consult Status: Follow-up Date: 10/28/15 Follow-up type: In-patient    Patricia Navarro, Patricia Navarro Navarro, 5:53 PM

## 2015-10-28 MED ORDER — NORETHINDRONE 0.35 MG PO TABS: 1.0000 | ORAL_TABLET | Freq: Every day | ORAL | Status: DC

## 2015-10-28 MED ORDER — IBUPROFEN 600 MG PO TABS: 600.0000 mg | ORAL_TABLET | Freq: Four times a day (QID) | ORAL | Status: DC

## 2015-10-28 MED ORDER — PNEUMOCOCCAL VAC POLYVALENT 25 MCG/0.5ML IJ INJ
0.5000 mL | INJECTION | INTRAMUSCULAR | Status: AC
  Administered 2015-10-28: 0.5 mL via INTRAMUSCULAR
  Filled 2015-10-28: qty 0.5

## 2015-10-28 MED ORDER — PNEUMOCOCCAL VAC POLYVALENT 25 MCG/0.5ML IJ INJ: 0.5000 mL | INJECTION | INTRAMUSCULAR | Status: DC

## 2015-10-28 NOTE — Progress Notes (Deleted)
Post Partum Day 1 Subjective: no complaints, up ad lib, voiding and tolerating PO, small lochia, plans to breastfeed, plans to bottle feed, oral progesterone-only contraceptive, Depo-Provera  Objective: Blood pressure 117/70, pulse 89, temperature 97.7 F (36.5 C), temperature source Oral, resp. rate 20, height 5\' 5"  (1.651 m), weight 92.534 kg (204 lb), last menstrual period 01/25/2015, SpO2 100 %, unknown if currently breastfeeding.  Physical Exam:  General: alert, cooperative and no distress Lochia:normal flow Chest: CTAB Heart: RRR no m/r/g Abdomen: +BS, soft, nontender,  Uterine Fundus: firm DVT Evaluation: No evidence of DVT seen on physical exam. Extremities: trace edema   Recent Labs  10/26/15 1820 10/27/15 0537  HGB 10.3* 9.3*  HCT 31.5* 28.5*    Assessment/Plan: Plan for discharge tomorrow and Lactation consult   LOS: 2 days   Navarro,Patricia Placzek 10/28/2015, 7:39 AM

## 2015-10-28 NOTE — Discharge Summary (Signed)
OB Discharge Summary     Patient Name: Patricia Navarro DOB: [birth year] MRN: [id number]  Date of admission: 10/26/2015 Delivering MD: Anders SimmondsGAMBINO, CHRISTINA M   Date of discharge: 10/28/2015  Admitting diagnosis: INDUCTION Intrauterine pregnancy: 1568w2d     Secondary diagnosis:  Active Problems:   Gestational hypertension w/o significant proteinuria in 3rd trimester  Additional problems:      Discharge diagnosis: Term Pregnancy Delivered                                                                                                Post partum procedures:none  Augmentation: AROM, Cytotec and Foley Balloon  Complications: None  Hospital course:  Induction of Labor With Vaginal Delivery   35 y.o. yo [id number] at 168w2d was admitted to the hospital 10/26/2015 for induction of labor.  Indication for induction: Gestational hypertension.  Patient had an uncomplicated labor course as follows: Membrane Rupture Time/Date: 5:24 AM ,10/27/2015   Intrapartum Procedures: Episiotomy: None [1]                                         Lacerations:  None [1]  Patient had delivery of a Viable infant.  Information for the patient's newborn:  Patricia Navarro, Boy Tarica [[id number]][[id number]]  Delivery Method: Vaginal, Spontaneous Delivery (Filed from Delivery Summary)   10/27/2015  Details of delivery can be found in separate delivery note.  Patient had a routine postpartum course. Patient is discharged home 10/28/2015.   Physical exam  Filed Vitals:   10/27/15 1415 10/27/15 1747 10/27/15 1900 10/28/15 0603  BP: 110/63 109/57 106/51 117/70  Pulse: 100 86 70 89  Temp: 98.8 F (37.1 C) 98.1 F (36.7 C) 98.8 F (37.1 C) 97.7 F (36.5 C)  TempSrc: Oral Oral Oral Oral  Resp: 20 18 20 20   Height:      Weight:      SpO2: 100%  100%    General: alert, cooperative and no distress Lochia: appropriate Uterine Fundus: firm Incision: N/A DVT Evaluation: No evidence of DVT seen on physical exam. Labs: Lab Results   Component Value Date   WBC 10.3 10/27/2015   HGB 9.3* 10/27/2015   HCT 28.5* 10/27/2015   MCV 84.1 10/27/2015   PLT 223 10/27/2015   CMP Latest Ref Rng 10/26/2015  Glucose 65 - 99 mg/dL 74  BUN 6 - 20 mg/dL 8  Creatinine 1.300.44 - 8.651.00 mg/dL 7.840.55  Sodium 696135 - 295145 mmol/L 134(L)  Potassium 3.5 - 5.1 mmol/L 3.8  Chloride 101 - 111 mmol/L 106  CO2 22 - 32 mmol/L 20(L)  Calcium 8.9 - 10.3 mg/dL 8.3(L)  Total Protein 6.5 - 8.1 g/dL 6.4(L)  Total Bilirubin 0.3 - 1.2 mg/dL 0.5  Alkaline Phos 38 - 126 U/L 111  AST 15 - 41 U/L 17  ALT 14 - 54 U/L 11(L)    Discharge instruction: per After Visit Summary and "Baby and Me Booklet".  After visit meds:    Medication List    STOP taking  these medications        acetaminophen 325 MG tablet  Commonly known as:  TYLENOL      TAKE these medications        albuterol 108 (90 Base) MCG/ACT inhaler  Commonly known as:  PROVENTIL HFA;VENTOLIN HFA  Inhale 2 puffs into the lungs every 6 (six) hours as needed for wheezing or shortness of breath.     ibuprofen 600 MG tablet  Commonly known as:  ADVIL,MOTRIN  Take 1 tablet (600 mg total) by mouth every 6 (six) hours.     norethindrone 0.35 MG tablet  Commonly known as:  ORTHO MICRONOR  Take 1 tablet (0.35 mg total) by mouth daily.     prenatal multivitamin Tabs tablet  Take 1 tablet by mouth daily at 12 noon.        Diet: routine diet  Activity: Advance as tolerated. Pelvic rest for 6 weeks.   Outpatient follow up:4 weeks Follow up Appt:Future Appointments Date Time Provider Department Center  11/30/2015 10:00 AM Jacklyn Shell, CNM [facility]   Follow up Visit:No Follow-up on file.  Postpartum contraception: Progesterone only pills  Newborn Data: Live born female  Birth Weight: 8 lb 8.9 oz (3880 g) APGAR: 7, 9  Baby Feeding: Breast Disposition:home with mother   10/28/2015 Greig Right, CNM

## 2015-10-28 NOTE — Discharge Instructions (Signed)

## 2015-10-28 NOTE — Lactation Note (Signed)
This note was copied from a baby's chart. Lactation Consultation Note  Patient Name: Patricia Julien NordmannJennifer Navarro WGNFA'OToday's Date: 10/28/2015 Reason for consult: Follow-up assessment  Baby 27 hours old. Parents hoping for early D/C. Mom reports that the baby is nursing well and she denies any breast/nipple pain. Mom aware of OP/BFSG and LC phone line assistance after D/C.  Maternal Data    Feeding Feeding Type: Breast Fed Length of feed: 30 min  LATCH Score/Interventions                      Lactation Tools Discussed/Used     Consult Status Consult Status: Follow-up Date: 10/29/15 Follow-up type: In-patient    Geralynn OchsWILLIARD, Sherald 10/28/2015, 10:20 AM

## 2015-11-01 ENCOUNTER — Telehealth: Payer: Self-pay | Admitting: *Deleted

## 2015-11-01 NOTE — Telephone Encounter (Signed)
[facility] pt delivered SVD, October 27, 2015, HGB 8.7 today, taking PNV but no longer taking the FE supplement. Please advise.

## 2015-11-01 NOTE — Telephone Encounter (Signed)
Pt informed per Joellyn HaffKim Booker, CNM to continue PNV daily, resume FE BID, take w/OJ, increase FE-rich foods (red meat, green leafy vegs, beans, etc). Pt verbalized understanding.

## 2015-11-30 ENCOUNTER — Ambulatory Visit (INDEPENDENT_AMBULATORY_CARE_PROVIDER_SITE_OTHER): Payer: 59 | Admitting: Women's Health

## 2015-11-30 ENCOUNTER — Encounter: Payer: Self-pay | Admitting: Women's Health

## 2015-11-30 NOTE — Progress Notes (Signed)
Patient ID: Patricia Navarro, female   DOB: 1980/10/28, 35 y.o.   MRN: [id number] Subjective:    Patricia Navarro is a 35 y.o. 34P4004 Caucasian female who presents for a postpartum visit. She is 4 weeks postpartum following a spontaneous vaginal delivery at 39.1 gestational weeks after IOL for GHTN. Anesthesia: epidural. I have fully reviewed the prenatal and intrapartum course. Postpartum course has been uncomplicated. Baby's course has been uncomplicated. Baby is feeding by breast. Bleeding no bleeding. Bowel function is normal. Bladder function is normal. Patient is not sexually active. Last sexual activity: prior to birth of baby. Contraception method is already taking micronor-doing well w/ remembering- has alarm set on phone. Postpartum depression screening: negative. Score 0.  Last pap 03/29/15 and was neg w/ +HRHPV.  The following portions of the patient's history were reviewed and updated as appropriate: allergies, current medications, past medical history, past surgical history and problem list.  Review of Systems Pertinent items are noted in HPI.   Filed Vitals:   11/30/15 1006  BP: 102/62  Pulse: 76  Weight: 181 lb (82.101 kg)   No LMP recorded.  Objective:   General:  alert, cooperative and no distress   Breasts:  deferred, no complaints  Lungs: clear to auscultation bilaterally  Heart:  regular rate and rhythm  Abdomen: soft, nontender   Vulva: normal  Vagina: normal vagina  Cervix:  closed  Corpus: Well-involuted  Adnexa:  Non-palpable  Rectal Exam: No hemorrhoids        Assessment:   Postpartum exam 4 wks s/p SVB after IOL for GHTN Breastfeeding Depression screening Contraception counseling   Plan:   Contraception: continue micronor Follow up in: after 8/23  for pap & physical or earlier if needed  Marge DuncansBooker, [name] CNM, [facility] 11/30/2015 10:13 AM

## 2015-11-30 NOTE — Patient Instructions (Signed)
Tips To Increase Milk Supply  Lots of water! Enough so that your urine is clear  Plenty of calories, if you're not getting enough calories, your milk supply can decrease  Breastfeed/pump often, every 2-3 hours x 20-56mins  Fenugreek 3 pills 3 times a day, this may make your urine smell like maple syrup  Mother's Milk Tea  Lactation cookies, google for the recipe  Real oatmeal   Norethindrone tablets (contraception)-Micronor What is this medicine? NORETHINDRONE (nor eth IN drone) is an oral contraceptive. The product contains a female hormone known as a progestin. It is used to prevent pregnancy. This medicine may be used for other purposes; ask your health care provider or pharmacist if you have questions. What should I tell my health care provider before I take this medicine? They need to know if you have any of these conditions: -blood vessel disease or blood clots -breast, cervical, or vaginal cancer -diabetes -heart disease -kidney disease -liver disease -mental depression -migraine -seizures -stroke -vaginal bleeding -an unusual or allergic reaction to norethindrone, other medicines, foods, dyes, or preservatives -pregnant or trying to get pregnant -breast-feeding How should I use this medicine? Take this medicine by mouth with a glass of water. You may take it with or without food. Follow the directions on the prescription label. Take this medicine at the same time each day and in the order directed on the package. Do not take your medicine more often than directed. Contact your pediatrician regarding the use of this medicine in children. Special care may be needed. This medicine has been used in female children who have started having menstrual periods. A patient package insert for the product will be given with each prescription and refill. Read this sheet carefully each time. The sheet may change frequently. Overdosage: If you think you have taken too much of this  medicine contact a poison control center or emergency room at once. NOTE: This medicine is only for you. Do not share this medicine with others. What if I miss a dose? Try not to miss a dose. Every time you miss a dose or take a dose late your chance of pregnancy increases. When 1 pill is missed (even if only 3 hours late), take the missed pill as soon as possible and continue taking a pill each day at the regular time (use a back up method of birth control for the next 48 hours). If more than 1 dose is missed, use an additional birth control method for the rest of your pill pack until menses occurs. Contact your health care professional if more than 1 dose has been missed. What may interact with this medicine? Do not take this medicine with any of the following medications: -amprenavir or fosamprenavir -bosentan This medicine may also interact with the following medications: -antibiotics or medicines for infections, especially rifampin, rifabutin, rifapentine, and griseofulvin, and possibly penicillins or tetracyclines -aprepitant -barbiturate medicines, such as phenobarbital -carbamazepine -felbamate -modafinil -oxcarbazepine -phenytoin -ritonavir or other medicines for HIV infection or AIDS -St. John's wort -topiramate This list may not describe all possible interactions. Give your health care provider a list of all the medicines, herbs, non-prescription drugs, or dietary supplements you use. Also tell them if you smoke, drink alcohol, or use illegal drugs. Some items may interact with your medicine. What should I watch for while using this medicine? Visit your doctor or health care professional for regular checks on your progress. You will need a regular breast and pelvic exam and Pap smear while  on this medicine. Use an additional method of birth control during the first cycle that you take these tablets. If you have any reason to think you are pregnant, stop taking this medicine right  away and contact your doctor or health care professional. If you are taking this medicine for hormone related problems, it may take several cycles of use to see improvement in your condition. This medicine does not protect you against HIV infection (AIDS) or any other sexually transmitted diseases. What side effects may I notice from receiving this medicine? Side effects that you should report to your doctor or health care professional as soon as possible: -breast tenderness or discharge -pain in the abdomen, chest, groin or leg -severe headache -skin rash, itching, or hives -sudden shortness of breath -unusually weak or tired -vision or speech problems -yellowing of skin or eyes Side effects that usually do not require medical attention (report to your doctor or health care professional if they continue or are bothersome): -changes in sexual desire -change in menstrual flow -facial hair growth -fluid retention and swelling -headache -irritability -nausea -weight gain or loss This list may not describe all possible side effects. Call your doctor for medical advice about side effects. You may report side effects to FDA at 1-800-FDA-1088. Where should I keep my medicine? Keep out of the reach of children. Store at room temperature between 15 and 30 degrees C (59 and 86 degrees F). Throw away any unused medicine after the expiration date. NOTE: This sheet is a summary. It may not cover all possible information. If you have questions about this medicine, talk to your doctor, pharmacist, or health care provider.    2016, Elsevier/Gold Standard. (2012-04-11 16:41:35)

## 2016-01-16 ENCOUNTER — Telehealth: Payer: Self-pay | Admitting: *Deleted

## 2016-01-16 NOTE — Telephone Encounter (Signed)
Pt requesting a 90 day supply of Micronor due to insurance benefits. Does pt need to start Micronor when she gets refilled or wait until Sunday?

## 2016-01-17 ENCOUNTER — Other Ambulatory Visit: Payer: Self-pay | Admitting: Advanced Practice Midwife

## 2016-01-17 MED ORDER — NORETHINDRONE 0.35 MG PO TABS: 1.0000 | ORAL_TABLET | Freq: Every day | ORAL | Status: DC

## 2016-01-17 NOTE — Telephone Encounter (Signed)
Sent to pharmacy.  Can start any day.  If she has been off of it for more than 5 days, use condoms for 3 weeks

## 2016-04-04 ENCOUNTER — Ambulatory Visit (INDEPENDENT_AMBULATORY_CARE_PROVIDER_SITE_OTHER): Payer: 59 | Admitting: Women's Health

## 2016-04-04 ENCOUNTER — Encounter: Payer: Self-pay | Admitting: Women's Health

## 2016-04-04 ENCOUNTER — Other Ambulatory Visit (HOSPITAL_COMMUNITY)
Admission: RE | Admit: 2016-04-04 | Discharge: 2016-04-04 | Disposition: A | Discharge status: Home / Self Care | Payer: 59 | Source: Ambulatory Visit | Attending: Obstetrics & Gynecology | Admitting: Obstetrics & Gynecology

## 2016-04-04 ENCOUNTER — Ambulatory Visit (INDEPENDENT_AMBULATORY_CARE_PROVIDER_SITE_OTHER): Payer: 59 | Admitting: *Deleted

## 2016-04-04 ENCOUNTER — Encounter: Payer: Self-pay | Admitting: *Deleted

## 2016-04-04 VITALS — BP 128/88 | HR 72 | Ht 64.5 in | Wt 179.0 lb

## 2016-04-04 DIAGNOSIS — Z113 Encounter for screening for infections with a predominantly sexual mode of transmission: Secondary | ICD-10-CM

## 2016-04-04 DIAGNOSIS — Z3202 Encounter for pregnancy test, result negative: Secondary | ICD-10-CM

## 2016-04-04 DIAGNOSIS — Z1151 Encounter for screening for human papillomavirus (HPV): Secondary | ICD-10-CM | POA: Diagnosis present

## 2016-04-04 DIAGNOSIS — Z01411 Encounter for gynecological examination (general) (routine) with abnormal findings: Secondary | ICD-10-CM | POA: Insufficient documentation

## 2016-04-04 DIAGNOSIS — Z8759 Personal history of other complications of pregnancy, childbirth and the puerperium: Secondary | ICD-10-CM | POA: Insufficient documentation

## 2016-04-04 DIAGNOSIS — Z01419 Encounter for gynecological examination (general) (routine) without abnormal findings: Secondary | ICD-10-CM | POA: Diagnosis not present

## 2016-04-04 DIAGNOSIS — Z3042 Encounter for surveillance of injectable contraceptive: Secondary | ICD-10-CM

## 2016-04-04 LAB — POCT URINE PREGNANCY: PREG TEST UR: NEGATIVE

## 2016-04-04 MED ORDER — MEDROXYPROGESTERONE ACETATE 150 MG/ML IM SUSP: 150.0000 mg | INTRAMUSCULAR | 3 refills | Status: DC

## 2016-04-04 MED ORDER — MEDROXYPROGESTERONE ACETATE 150 MG/ML IM SUSP
150.0000 mg | Freq: Once | INTRAMUSCULAR | Status: AC
  Administered 2016-04-04: 150 mg via INTRAMUSCULAR

## 2016-04-04 NOTE — Progress Notes (Signed)
Subjective:   Patricia Navarro is a 35 y.o. G7P4004 Caucasian female here for a routine well-woman exam.  Patient's last menstrual period was 03/31/2016.    Current complaints: can't remember to take micronor on time, has alarm set, but not always near pills when it goes off. Baby has weaned himself from nursing, she still pumps some. She would like to switch to depo.  PCP: Juanetta Gosling       Does desire labs, not fasting today  Social History: Sexual: heterosexual Marital Status: married Living situation: w/ husband and children Occupation: Sales executive  Tobacco/alcohol: no tobacco or etoh Illicit drugs: no history of illicit drug use  The following portions of the patient's history were reviewed and updated as appropriate: allergies, current medications, past family history, past medical history, past social history, past surgical history and problem list.  Past Medical History Past Medical History:  Diagnosis Date  . Hypoglycemia     Past Surgical History Past Surgical History:  Procedure Laterality Date  . EYE SURGERY    . TONSILLECTOMY      Gynecologic History [id number]  Patient's last menstrual period was 03/31/2016. Contraception: POPs, wants to switch to depo Last Pap: 03/29/15. Results were: neg w/ +HRHPV Last mammogram: never. Results were: n/a Last TCS: never, maternal grandfather dx w/ & passed away from colon CA in 30s  Obstetric History OB History  Gravida Para Term Preterm AB Living  4 4 4     4   SAB TAB Ectopic Multiple Live Births        0 4    # Outcome Date GA Lbr Len/2nd Weight Sex Delivery Anes PTL Lv  4 Term 10/27/15 [redacted]w[redacted]d 01:41 / 00:13 8 lb 8.9 oz (3.88 kg) M Vag-Spont EPI  LIV     Birth Comments: facial bruising  3 Term 05/28/10 [redacted]w[redacted]d  9 lb (4.082 kg) F Vag-Spont   LIV  2 Term 10/28/04 [redacted]w[redacted]d  8 lb 6 oz (3.799 kg) M Vag-Spont   LIV  1 Term 06/03/95 [redacted]w[redacted]d  6 lb 9 oz (2.977 kg) F Vag-Spont   LIV      Current Medications Current Outpatient  Prescriptions on File Prior to Visit  Medication Sig Dispense Refill  . albuterol (PROVENTIL HFA;VENTOLIN HFA) 108 (90 BASE) MCG/ACT inhaler Inhale 2 puffs into the lungs every 6 (six) hours as needed for wheezing or shortness of breath. 1 Inhaler 2  . ferrous sulfate 325 (65 FE) MG tablet Take 325 mg by mouth daily with breakfast.    . norethindrone (ORTHO MICRONOR) 0.35 MG tablet Take 1 tablet (0.35 mg total) by mouth daily. 3 Package 3  . Prenatal Vit-Fe Fumarate-FA (PRENATAL MULTIVITAMIN) TABS tablet Take 1 tablet by mouth daily at 12 noon.    Marland Kitchen ibuprofen (ADVIL,MOTRIN) 600 MG tablet Take 1 tablet (600 mg total) by mouth every 6 (six) hours. (Patient not taking: Reported on 11/30/2015) 30 tablet 0   No current facility-administered medications on file prior to visit.     Review of Systems Patient denies any headaches, blurred vision, shortness of breath, chest pain, abdominal pain, problems with bowel movements, urination, or intercourse.  Objective:  BP 128/88 (BP Location: Right Arm, Patient Position: Sitting, Cuff Size: Normal)   Pulse 72   Ht 5' 4.5" (1.638 m)   Wt 179 lb (81.2 kg)   LMP 03/31/2016   Breastfeeding? Yes   BMI 30.25 kg/m  Physical Exam  General:  Well developed, well nourished, no acute distress. She is alert and  oriented x3. Skin:  Warm and dry Neck:  Midline trachea, no thyromegaly or nodules Cardiovascular: Regular rate and rhythm, no murmur heard Lungs:  Effort normal, all lung fields clear to auscultation bilaterally Breasts:  No dominant palpable mass, retraction, or nipple discharge Abdomen:  Soft, non tender, no hepatosplenomegaly or masses Pelvic:  External genitalia is normal in appearance.  The vagina is normal in appearance. The cervix is bulbous, no CMT.  Thin prep pap is done w/ HR HPV cotesting. Uterus is felt to be normal size, shape, and contour.  No adnexal masses or tenderness noted. Extremities:  No swelling or varicosities noted Psych:  She  has a normal mood and affect  Assessment:   Healthy well-woman exam Contraception management H/O +HRHPV STD screen for FP [place]  Plan:  GC/CT from pap, come back Friday for fasting labs: CBC, CMP, TSH, Lipid panel, A1C, HIV, RPR Rx depo w/ 3RF F/U this afternoon for depo, then Fri for labs, then 454yr for physical, or sooner if needed Mammogram @37yo  or sooner if problems Colonoscopy @37yo  or sooner if problems  Marge DuncansBooker, Becker Christopher Randall CNM, WHNP-BC 04/04/2016 9:08 AM

## 2016-04-04 NOTE — Progress Notes (Signed)
Pt here for Depo. Pt tolerated shot well. Return in 12 weeks for next shot. JSY 

## 2016-04-05 LAB — CYTOLOGY - PAP

## 2016-04-07 LAB — COMPREHENSIVE METABOLIC PANEL
ALBUMIN: 4.4 g/dL (ref 3.5–5.5)
ALT: 9 IU/L (ref 0–32)
AST: 16 IU/L (ref 0–40)
Albumin/Globulin Ratio: 1.6 (ref 1.2–2.2)
Alkaline Phosphatase: 68 IU/L (ref 39–117)
BUN / CREAT RATIO: 15 (ref 9–23)
BUN: 15 mg/dL (ref 6–20)
Bilirubin Total: 1.1 mg/dL (ref 0.0–1.2)
CALCIUM: 9 mg/dL (ref 8.7–10.2)
CO2: 22 mmol/L (ref 18–29)
CREATININE: 1.01 mg/dL — AB (ref 0.57–1.00)
Chloride: 102 mmol/L (ref 96–106)
GFR calc Af Amer: 82 mL/min/{1.73_m2} (ref >59)
GFR, EST NON AFRICAN AMERICAN: 71 mL/min/{1.73_m2} (ref >59)
GLOBULIN, TOTAL: 2.7 g/dL (ref 1.5–4.5)
Glucose: 79 mg/dL (ref 65–99)
Potassium: 4 mmol/L (ref 3.5–5.2)
SODIUM: 141 mmol/L (ref 134–144)
Total Protein: 7.1 g/dL (ref 6.0–8.5)

## 2016-04-07 LAB — TSH: TSH: 1.07 u[IU]/mL (ref 0.450–4.500)

## 2016-04-07 LAB — HEMOGLOBIN A1C
ESTIMATED AVERAGE GLUCOSE: 108 mg/dL
Hgb A1c MFr Bld: 5.4 % (ref 4.8–5.6)

## 2016-04-07 LAB — CBC
HEMATOCRIT: 38.9 % (ref 34.0–46.6)
HEMOGLOBIN: 13.3 g/dL (ref 11.1–15.9)
MCH: 28.7 pg (ref 26.6–33.0)
MCHC: 34.2 g/dL (ref 31.5–35.7)
MCV: 84 fL (ref 79–97)
Platelets: 315 10*3/uL (ref 150–379)
RBC: 4.64 x10E6/uL (ref 3.77–5.28)
RDW: 14.9 % (ref 12.3–15.4)
WBC: 8.3 10*3/uL (ref 3.4–10.8)

## 2016-04-07 LAB — LIPID PANEL
CHOLESTEROL TOTAL: 205 mg/dL — AB (ref 100–199)
Chol/HDL Ratio: 3.9 ratio units (ref 0.0–4.4)
HDL: 52 mg/dL (ref >39)
LDL CALC: 139 mg/dL — AB (ref 0–99)
Triglycerides: 70 mg/dL (ref 0–149)
VLDL CHOLESTEROL CAL: 14 mg/dL (ref 5–40)

## 2016-04-07 LAB — HIV ANTIBODY (ROUTINE TESTING W REFLEX): HIV Screen 4th Generation wRfx: NONREACTIVE

## 2016-04-07 LAB — RPR: RPR Ser Ql: NONREACTIVE

## 2016-04-11 ENCOUNTER — Telehealth: Payer: Self-pay | Admitting: Women's Health

## 2016-04-11 ENCOUNTER — Other Ambulatory Visit: Payer: Self-pay | Admitting: Women's Health

## 2016-04-11 DIAGNOSIS — R7989 Other specified abnormal findings of blood chemistry: Secondary | ICD-10-CM

## 2016-04-11 DIAGNOSIS — R87610 Atypical squamous cells of undetermined significance on cytologic smear of cervix (ASC-US): Secondary | ICD-10-CM

## 2016-04-11 DIAGNOSIS — R87619 Unspecified abnormal cytological findings in specimens from cervix uteri: Secondary | ICD-10-CM | POA: Insufficient documentation

## 2016-04-11 DIAGNOSIS — E7800 Pure hypercholesterolemia, unspecified: Secondary | ICD-10-CM | POA: Insufficient documentation

## 2016-04-11 NOTE — Telephone Encounter (Signed)
Notified pt of abnormal pap w/ +HRHPV, needs colpo. Also notified of elevated total cholesterol and LDL, recommended Red Krill Oil 1,500mg  daily, decrease red meats/carbs, wt loss, will recheck fasting lipids in 3mths. Switched to front to schedule colpo.  Cheral MarkerKimberly R. Booker, CNM, [facility] 04/11/2016 3:23 PM

## 2016-04-17 ENCOUNTER — Encounter: Payer: Self-pay | Admitting: Obstetrics and Gynecology

## 2016-04-17 ENCOUNTER — Other Ambulatory Visit: Payer: Self-pay | Admitting: Obstetrics and Gynecology

## 2016-04-17 ENCOUNTER — Ambulatory Visit (INDEPENDENT_AMBULATORY_CARE_PROVIDER_SITE_OTHER): Payer: 59 | Admitting: Obstetrics and Gynecology

## 2016-04-17 VITALS — BP 120/80 | HR 84 | Ht 64.0 in | Wt 180.4 lb

## 2016-04-17 DIAGNOSIS — R87610 Atypical squamous cells of undetermined significance on cytologic smear of cervix (ASC-US): Secondary | ICD-10-CM

## 2016-04-17 DIAGNOSIS — R87611 Atypical squamous cells cannot exclude high grade squamous intraepithelial lesion on cytologic smear of cervix (ASC-H): Secondary | ICD-10-CM

## 2016-04-17 DIAGNOSIS — Z3202 Encounter for pregnancy test, result negative: Secondary | ICD-10-CM

## 2016-04-17 DIAGNOSIS — R87811 Vaginal high risk human papillomavirus (HPV) DNA test positive: Secondary | ICD-10-CM | POA: Diagnosis not present

## 2016-04-17 LAB — POCT URINE PREGNANCY: Preg Test, Ur: NEGATIVE

## 2016-04-17 NOTE — Progress Notes (Signed)
Patricia EricJennifer L Navarro 35 y.o. [id number] here for colposcopy for ASCUS with POSITIVE high risk HPV pap smear on 04/04/2016. Discussed role for HPV in cervical dysplasia, need for surveillance.  Patient given informed consent, signed copy in the chart, time out was performed.  Placed in lithotomy position. Cervix viewed with speculum and colposcope after application of acetic acid.   Colposcopy adequate? Yes  acetowhite lesion(s) noted at 11 o'clock; biopsies obtained at 11 o'clock.   ECC specimen obtained: yes All specimens were labelled and sent to pathology.   Colposcopy IMPRESSION: ASCUS r/o CIN-I  Patient was given post procedure instructions. Will follow up pathology and manage accordingly.  Routine preventative health maintenance measures emphasized.  P:  1. Follow up in 1 year 2 biopsy results per Mychart.  By signing my name below, I, Soijett Blue, attest that this documentation has been prepared under the direction and in the presence of Tilda BurrowJohn V Kalei Mckillop, MD. Electronically Signed: Soijett Blue, ED Scribe. 04/17/16. 4:43 PM.  I personally performed the services described in this documentation, which was SCRIBED in my presence. The recorded information has been reviewed and considered accurate. It has been edited as necessary during review. Tilda BurrowFERGUSON,Thresa Dozier V, MD

## 2016-04-20 ENCOUNTER — Telehealth: Payer: Self-pay | Admitting: *Deleted

## 2016-04-23 NOTE — Telephone Encounter (Signed)
Pt informed of abnormal colposcopy 04/17/2016 (mild dysplasia). Pt scheduled for repeat pap in 6 months. Pt verbalized understanding.

## 2016-05-04 ENCOUNTER — Ambulatory Visit (INDEPENDENT_AMBULATORY_CARE_PROVIDER_SITE_OTHER): Payer: 59 | Admitting: Pediatrics

## 2016-05-04 DIAGNOSIS — Z23 Encounter for immunization: Secondary | ICD-10-CM

## 2016-05-04 NOTE — Progress Notes (Signed)
Here for flu shot only.  Patricia Nedeau, MD  

## 2016-06-27 ENCOUNTER — Encounter: Payer: Self-pay | Admitting: *Deleted

## 2016-06-27 ENCOUNTER — Ambulatory Visit (INDEPENDENT_AMBULATORY_CARE_PROVIDER_SITE_OTHER): Payer: 59 | Admitting: *Deleted

## 2016-06-27 DIAGNOSIS — Z3202 Encounter for pregnancy test, result negative: Secondary | ICD-10-CM

## 2016-06-27 DIAGNOSIS — Z308 Encounter for other contraceptive management: Secondary | ICD-10-CM | POA: Diagnosis not present

## 2016-06-27 LAB — POCT URINE PREGNANCY: Preg Test, Ur: NEGATIVE

## 2016-06-27 MED ORDER — MEDROXYPROGESTERONE ACETATE 150 MG/ML IM SUSP
150.0000 mg | Freq: Once | INTRAMUSCULAR | Status: AC
  Administered 2016-06-27: 150 mg via INTRAMUSCULAR

## 2016-06-27 NOTE — Progress Notes (Signed)
Depo Provera 150mg IM given in right deltoid with no complications. Pt to return in 12 weeks for next injection.  

## 2016-09-19 ENCOUNTER — Ambulatory Visit (INDEPENDENT_AMBULATORY_CARE_PROVIDER_SITE_OTHER): Payer: 59 | Admitting: *Deleted

## 2016-09-19 ENCOUNTER — Encounter: Payer: Self-pay | Admitting: *Deleted

## 2016-09-19 DIAGNOSIS — Z3202 Encounter for pregnancy test, result negative: Secondary | ICD-10-CM | POA: Diagnosis not present

## 2016-09-19 DIAGNOSIS — Z308 Encounter for other contraceptive management: Secondary | ICD-10-CM

## 2016-09-19 DIAGNOSIS — Z3042 Encounter for surveillance of injectable contraceptive: Secondary | ICD-10-CM | POA: Diagnosis not present

## 2016-09-19 LAB — POCT URINE PREGNANCY: PREG TEST UR: NEGATIVE

## 2016-09-19 MED ORDER — MEDROXYPROGESTERONE ACETATE 150 MG/ML IM SUSP
150.0000 mg | Freq: Once | INTRAMUSCULAR | Status: AC
  Administered 2016-09-19: 150 mg via INTRAMUSCULAR

## 2016-09-19 NOTE — Progress Notes (Signed)
Depo Provera 150 mg IM given in left deltoid with no complications. Pt tolerated well. Informed patient to return for next depo shot in 12 weeks.

## 2016-10-15 ENCOUNTER — Encounter: Payer: Self-pay | Admitting: Obstetrics and Gynecology

## 2016-10-15 ENCOUNTER — Other Ambulatory Visit (HOSPITAL_COMMUNITY)
Admission: RE | Admit: 2016-10-15 | Discharge: 2016-10-15 | Disposition: A | Discharge status: Home / Self Care | Payer: 59 | Source: Ambulatory Visit | Attending: Obstetrics and Gynecology | Admitting: Obstetrics and Gynecology

## 2016-10-15 ENCOUNTER — Ambulatory Visit (INDEPENDENT_AMBULATORY_CARE_PROVIDER_SITE_OTHER): Payer: 59 | Admitting: Obstetrics and Gynecology

## 2016-10-15 ENCOUNTER — Other Ambulatory Visit: Payer: Self-pay | Admitting: Obstetrics & Gynecology

## 2016-10-15 VITALS — BP 110/80 | HR 80 | Wt 188.4 lb

## 2016-10-15 DIAGNOSIS — Z124 Encounter for screening for malignant neoplasm of cervix: Secondary | ICD-10-CM

## 2016-10-15 DIAGNOSIS — N952 Postmenopausal atrophic vaginitis: Secondary | ICD-10-CM | POA: Diagnosis not present

## 2016-10-15 DIAGNOSIS — Z01419 Encounter for gynecological examination (general) (routine) without abnormal findings: Secondary | ICD-10-CM

## 2016-10-15 DIAGNOSIS — Z1151 Encounter for screening for human papillomavirus (HPV): Secondary | ICD-10-CM | POA: Insufficient documentation

## 2016-10-15 NOTE — Progress Notes (Signed)
Patient ID: Patricia EricJennifer L Navarro, female   DOB: 1980/08/25, 36 y.o.   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 pap smear done, next pap due in 1 year  2. return annually or prn 3    Annual mammogram and regular self exams advised  Subjective:   Chief Complaint  Patient presents with  . pap only     Patricia Navarro is a 36 y.o. female [phone] who presents for annual exam. No LMP recorded. The patient has no complaints today. She reports h/o abnormal pap, but no h/o treatment.   The following portions of the patient's history were reviewed and updated as appropriate: allergies, current medications, past family history, past medical history, past social history, past surgical history and problem list. Past Medical History:  Diagnosis Date  . Hypoglycemia     Past Surgical History:  Procedure Laterality Date  . EYE SURGERY    . TONSILLECTOMY       Current Outpatient Prescriptions:  .  ferrous sulfate 325 (65 FE) MG tablet, Take 325 mg by mouth daily with breakfast., Disp: , Rfl:  .  medroxyPROGESTERone (DEPO-PROVERA) 150 MG/ML injection, Inject 1 mL (150 mg total) into the muscle every 3 (three) months., Disp: 1 mL, Rfl: 3 .  albuterol (PROVENTIL HFA;VENTOLIN HFA) 108 (90 BASE) MCG/ACT inhaler, Inhale 2 puffs into the lungs every 6 (six) hours as needed for wheezing or shortness of breath. (Patient not taking: Reported on 10/15/2016), Disp: 1 Inhaler, Rfl: 2  Review of Systems Constitutional: negative Gastrointestinal: negative Genitourinary: negative    Objective:  BP 110/80   Pulse 80   Wt 188 lb 6.4 oz (85.5 kg)   BMI 32.34 kg/m    BMI: Body mass index is 32.34 kg/m.  General Appearance: Alert, appropriate appearance for age. No acute distress HEENT: Grossly normal Gastrointestinal: Soft, non-tender, no masses or organomegaly Pelvic Exam: External genitalia: normal general appearance Vaginal: normal mucosa without  prolapse or lesions Cervix: normal appearance and standard PAP obtained Skin: no rash or abnormalities Neurologic: Normal gait and speech, no tremor  Psychiatric: Alert and oriented, appropriate affect.  Urinalysis:Not done Labs her primary care physician Dr. Jerene DillingFusco Patricia Navarro. MD Pgr [phone] 9:26 AM   By signing my name below, I, Doreatha MartinEva Mathews, attest that this documentation has been prepared under the direction and in the presence of Tilda BurrowJohn Jabbar Palmero V, MD. Electronically Signed: Doreatha MartinEva Mathews, ED Scribe. 10/15/16. 9:35 AM.  I personally performed the services described in this documentation, which was SCRIBED in my presence. The recorded information has been reviewed and considered accurate. It has been edited as necessary during review. Tilda BurrowFERGUSON,Jia Dottavio V, MD

## 2016-10-17 LAB — CYTOLOGY - PAP
DIAGNOSIS: NEGATIVE
HPV (WINDOPATH): NOT DETECTED

## 2016-12-12 ENCOUNTER — Ambulatory Visit (INDEPENDENT_AMBULATORY_CARE_PROVIDER_SITE_OTHER): Payer: 59

## 2016-12-12 VITALS — Wt 187.0 lb

## 2016-12-12 DIAGNOSIS — Z3202 Encounter for pregnancy test, result negative: Secondary | ICD-10-CM | POA: Diagnosis not present

## 2016-12-12 DIAGNOSIS — Z3042 Encounter for surveillance of injectable contraceptive: Secondary | ICD-10-CM | POA: Diagnosis not present

## 2016-12-12 LAB — POCT URINE PREGNANCY: Preg Test, Ur: NEGATIVE

## 2016-12-12 MED ORDER — MEDROXYPROGESTERONE ACETATE 150 MG/ML IM SUSP
150.0000 mg | Freq: Once | INTRAMUSCULAR | Status: AC
  Administered 2016-12-12: 150 mg via INTRAMUSCULAR

## 2016-12-12 NOTE — Progress Notes (Signed)
PT here for depo shot 150 mg IM given RT Deltoid. Tolerated well. Return 12 weeks for next shot. Pad CMA 

## 2017-03-05 ENCOUNTER — Telehealth: Payer: Self-pay | Admitting: Women's Health

## 2017-03-05 ENCOUNTER — Other Ambulatory Visit: Payer: Self-pay | Admitting: Women's Health

## 2017-03-05 MED ORDER — MEDROXYPROGESTERONE ACETATE 150 MG/ML IM SUSP: 150.0000 mg | INTRAMUSCULAR | 3 refills | Status: DC

## 2017-03-05 NOTE — Telephone Encounter (Signed)
Pt aware I refilled depo

## 2017-03-05 NOTE — Telephone Encounter (Signed)
Patient called stating that she has called her pharmacy and they sent over a faxed stating that she needs a refill of her Depo. Pt states that she needs this filled today because she has an appointment tomorrow for the shot. Please contact pt

## 2017-03-06 ENCOUNTER — Other Ambulatory Visit: Payer: Self-pay | Admitting: Women's Health

## 2017-03-06 ENCOUNTER — Ambulatory Visit (INDEPENDENT_AMBULATORY_CARE_PROVIDER_SITE_OTHER): Payer: 59 | Admitting: *Deleted

## 2017-03-06 ENCOUNTER — Encounter: Payer: Self-pay | Admitting: *Deleted

## 2017-03-06 DIAGNOSIS — Z3042 Encounter for surveillance of injectable contraceptive: Secondary | ICD-10-CM | POA: Diagnosis not present

## 2017-03-06 DIAGNOSIS — Z3202 Encounter for pregnancy test, result negative: Secondary | ICD-10-CM

## 2017-03-06 LAB — POCT URINE PREGNANCY: PREG TEST UR: NEGATIVE

## 2017-03-06 MED ORDER — MEDROXYPROGESTERONE ACETATE 150 MG/ML IM SUSP
150.0000 mg | Freq: Once | INTRAMUSCULAR | Status: AC
  Administered 2017-03-06: 150 mg via INTRAMUSCULAR

## 2017-03-06 NOTE — Progress Notes (Signed)
Pt given DepoProvera 150mg IM left deltoid without complications. Advised pt to return in 12 weeks for next injection.  

## 2017-05-29 ENCOUNTER — Ambulatory Visit (INDEPENDENT_AMBULATORY_CARE_PROVIDER_SITE_OTHER): Payer: 59

## 2017-05-29 ENCOUNTER — Encounter: Payer: Self-pay | Admitting: Adult Health

## 2017-05-29 VITALS — Wt 189.0 lb

## 2017-05-29 DIAGNOSIS — Z3202 Encounter for pregnancy test, result negative: Secondary | ICD-10-CM | POA: Diagnosis not present

## 2017-05-29 DIAGNOSIS — Z3042 Encounter for surveillance of injectable contraceptive: Secondary | ICD-10-CM

## 2017-05-29 LAB — POCT URINE PREGNANCY: PREG TEST UR: NEGATIVE

## 2017-05-29 MED ORDER — MEDROXYPROGESTERONE ACETATE 150 MG/ML IM SUSP
150.0000 mg | Freq: Once | INTRAMUSCULAR | Status: AC
  Administered 2017-05-29: 150 mg via INTRAMUSCULAR

## 2017-05-29 NOTE — Progress Notes (Signed)
PT here for depo shot 150 mg IM given rt deltoid. Tolerated well. Return 12 week for next shot. Pad CMA 

## 2017-08-06 HISTORY — PX: WISDOM TOOTH EXTRACTION: SHX21

## 2017-08-18 ENCOUNTER — Other Ambulatory Visit: Payer: Self-pay | Admitting: Advanced Practice Midwife

## 2017-08-20 ENCOUNTER — Telehealth: Payer: Self-pay | Admitting: Obstetrics & Gynecology

## 2017-08-20 ENCOUNTER — Other Ambulatory Visit: Payer: Self-pay | Admitting: Advanced Practice Midwife

## 2017-08-20 NOTE — Telephone Encounter (Signed)
Spoke to pharmacy who stated refill request for Shaune PascalCamilla was sent not Depo. Informed patient that she does still have refills on injection. Verbalized understanding.

## 2017-08-20 NOTE — Progress Notes (Signed)
Camilla refilled d/t request sent from pharmacy.  Pt is on depo, which has refills through 7/19. Camilla d/c'd, probably requested in error from pharmacy when pt called to [facility]" (has appt for depo tomorrow). Pharmacy notified to refill depo, not pills

## 2017-08-21 ENCOUNTER — Encounter: Payer: Self-pay | Admitting: Obstetrics & Gynecology

## 2017-08-21 ENCOUNTER — Ambulatory Visit (INDEPENDENT_AMBULATORY_CARE_PROVIDER_SITE_OTHER): Payer: 59

## 2017-08-21 VITALS — Wt 192.0 lb

## 2017-08-21 DIAGNOSIS — Z3042 Encounter for surveillance of injectable contraceptive: Secondary | ICD-10-CM | POA: Diagnosis not present

## 2017-08-21 DIAGNOSIS — Z3202 Encounter for pregnancy test, result negative: Secondary | ICD-10-CM

## 2017-08-21 LAB — POCT URINE PREGNANCY: PREG TEST UR: NEGATIVE

## 2017-08-21 MED ORDER — MEDROXYPROGESTERONE ACETATE 150 MG/ML IM SUSP
150.0000 mg | Freq: Once | INTRAMUSCULAR | Status: AC
  Administered 2017-08-21: 150 mg via INTRAMUSCULAR

## 2017-08-21 NOTE — Progress Notes (Signed)
Pt here for depo shot 150 mg IM given lt deltoid. Tolerated well. Return 12 weeks for next shot. Pad CMA.

## 2017-11-13 ENCOUNTER — Ambulatory Visit (INDEPENDENT_AMBULATORY_CARE_PROVIDER_SITE_OTHER): Payer: 59 | Admitting: *Deleted

## 2017-11-13 ENCOUNTER — Ambulatory Visit: Payer: 59

## 2017-11-13 ENCOUNTER — Encounter: Payer: Self-pay | Admitting: *Deleted

## 2017-11-13 DIAGNOSIS — Z3042 Encounter for surveillance of injectable contraceptive: Secondary | ICD-10-CM | POA: Diagnosis not present

## 2017-11-13 DIAGNOSIS — Z3202 Encounter for pregnancy test, result negative: Secondary | ICD-10-CM

## 2017-11-13 DIAGNOSIS — Z308 Encounter for other contraceptive management: Secondary | ICD-10-CM

## 2017-11-13 LAB — POCT URINE PREGNANCY: Preg Test, Ur: NEGATIVE

## 2017-11-13 MED ORDER — MEDROXYPROGESTERONE ACETATE 150 MG/ML IM SUSP
150.0000 mg | Freq: Once | INTRAMUSCULAR | Status: AC
  Administered 2017-11-13: 150 mg via INTRAMUSCULAR

## 2017-11-13 NOTE — Progress Notes (Signed)
Pt here for Depo. Pt tolerated shot well. Return in 12 weeks for next shot. Pt mentioned being interested in tubal. Advised can schedule appt with provider to discuss before next shot is due. Pt voiced understanding. JSY

## 2017-11-14 ENCOUNTER — Ambulatory Visit: Payer: Medicaid Other | Admitting: Obstetrics and Gynecology

## 2017-12-05 ENCOUNTER — Ambulatory Visit (INDEPENDENT_AMBULATORY_CARE_PROVIDER_SITE_OTHER): Payer: 59 | Admitting: Obstetrics and Gynecology

## 2017-12-05 ENCOUNTER — Encounter: Payer: Self-pay | Admitting: Obstetrics and Gynecology

## 2017-12-05 VITALS — BP 110/90 | HR 86 | Ht 64.0 in | Wt 188.6 lb

## 2017-12-05 DIAGNOSIS — Z302 Encounter for sterilization: Secondary | ICD-10-CM | POA: Diagnosis not present

## 2017-12-05 DIAGNOSIS — Z3009 Encounter for other general counseling and advice on contraception: Secondary | ICD-10-CM

## 2017-12-05 NOTE — Progress Notes (Signed)
Patient ID: Patricia Navarro, female   DOB: [birth year], 37 y.o.   MRN: [id number]  Patricia Navarro is here to discuss permanent sterilization. Her children are 23, 13, 7, and 2. She no longer has periods due to being on the depo shot. She thinks the shot has also caused her some weight gain. [name] has an umbilical hernia. She denies any other symptoms or complaints at this time.   Discussion: 1. Discussed with pt the risks and benefits of a tubal ligation and what all of her options are.  At end of discussion, pt had opportunity to ask questions and has no further questions at this time.   Specific discussion of tubal ligation as noted above. Greater than 50% was spent in counseling and coordination of care with the patient.   Total time greater than: 25 minutes.   A: 1. Permanent Sterilization  2. Umbilical hernia  P:  1. Tubal papers signed, copy given to patient 2. Schedule tubal ligation and umbilical hernia repair 3. F/u 4 weeks for pre-op  By signing my name below, I, Diona Browner, attest that this documentation has been prepared under the direction and in the presence of Tilda Burrow, MD. Electronically Signed: Diona Browner, Medical Scribe. 12/05/17. 4:22 PM.  I personally performed the services described in this documentation, which was SCRIBED in my presence. The recorded information has been reviewed and considered accurate. It has been edited as necessary during review. Tilda Burrow, MD

## 2017-12-19 ENCOUNTER — Ambulatory Visit (INDEPENDENT_AMBULATORY_CARE_PROVIDER_SITE_OTHER): Payer: 59 | Admitting: Women's Health

## 2017-12-19 ENCOUNTER — Other Ambulatory Visit (HOSPITAL_COMMUNITY)
Admission: RE | Admit: 2017-12-19 | Discharge: 2017-12-19 | Disposition: A | Discharge status: Home / Self Care | Payer: 59 | Source: Ambulatory Visit | Attending: Obstetrics & Gynecology | Admitting: Obstetrics & Gynecology

## 2017-12-19 ENCOUNTER — Encounter: Payer: Self-pay | Admitting: Women's Health

## 2017-12-19 VITALS — BP 110/80 | HR 96 | Ht 64.0 in | Wt 188.6 lb

## 2017-12-19 DIAGNOSIS — Z3009 Encounter for other general counseling and advice on contraception: Secondary | ICD-10-CM

## 2017-12-19 DIAGNOSIS — Z113 Encounter for screening for infections with a predominantly sexual mode of transmission: Secondary | ICD-10-CM | POA: Insufficient documentation

## 2017-12-19 DIAGNOSIS — Z01419 Encounter for gynecological examination (general) (routine) without abnormal findings: Secondary | ICD-10-CM

## 2017-12-19 DIAGNOSIS — Z1151 Encounter for screening for human papillomavirus (HPV): Secondary | ICD-10-CM | POA: Insufficient documentation

## 2017-12-19 NOTE — Progress Notes (Signed)
   WELL-WOMAN EXAMINATION Patient name: Patricia Navarro MRN [id number]  Date of birth: 10-Sep-1980 Chief Complaint:   Gynecologic Exam (pap/physcial)  History of Present Illness:   Patricia Navarro is a 37 y.o. [phone] Caucasian female being seen today for a routine well-woman exam.  Current complaints: none, scheduled for pre-op for BTL in 2wks H/O abnormal paps  PCP: Juanetta Gosling      does not desire labs No LMP recorded. Patient has had an injection. The current method of family planning is Depo-Provera injections Last pap 10/15/16. Results were: neg w/ -HRHPV Last mammogram: never. Results were: n/a Last colonoscopy: never. Results were: n/a  Review of Systems:   Pertinent items are noted in HPI Denies any headaches, blurred vision, fatigue, shortness of breath, chest pain, abdominal pain, abnormal vaginal discharge/itching/odor/irritation, problems with periods, bowel movements, urination, or intercourse unless otherwise stated above. Pertinent History Reviewed:  Reviewed past medical,surgical, social and family history.  Reviewed problem list, medications and allergies. Physical Assessment:   Vitals:   12/19/17 1550  BP: 110/80  Pulse: 96  Weight: 188 lb 9.6 oz (85.5 kg)  Height:  (1.626 m)  Body mass index is 32.37 kg/m.        Physical Examination:   General appearance - well appearing, and in no distress  Mental status - alert, oriented to person, place, and time  Psych:  She has a normal mood and affect  Skin - warm and dry, normal color, no suspicious lesions noted  Chest - effort normal, all lung fields clear to auscultation bilaterally  Heart - normal rate and regular rhythm  Neck:  midline trachea, no thyromegaly or nodules  Breasts - breasts appear normal, no suspicious masses, no skin or nipple changes or  axillary nodes  Abdomen - soft, nontender, nondistended, no masses or organomegaly  Pelvic - VULVA: normal appearing vulva with no masses, tenderness  or lesions  VAGINA: normal appearing vagina with normal color and discharge, no lesions  CERVIX: normal appearing cervix without discharge or lesions, no CMT  Thin prep pap is done w/ HR HPV cotesting  UTERUS: uterus is felt to be normal size, shape, consistency and nontender   ADNEXA: No adnexal masses or tenderness noted.  Extremities:  No swelling or varicosities noted  No results found for this or any previous visit (from the past 24 hour(s)).  Assessment & Plan:  1) Well-Woman Exam  2) STD screen  3) H/O abnormal paps  Labs/procedures today: pap w/ gc/ct, hiv, rpr  Mammogram  or sooner if problems Colonoscopy  or sooner if problems  Orders Placed This Encounter  Procedures  . HIV antibody  . RPR    Follow-up: Return for As scheduled. for pre-op for BTL  Cheral Marker CNM, [facility] 12/19/2017 4:24 PM

## 2017-12-19 NOTE — Addendum Note (Signed)
Addended by: Federico Flake A on: 12/19/2017 04:37 PM   Modules accepted: Orders

## 2017-12-20 LAB — RPR: RPR Ser Ql: NONREACTIVE

## 2017-12-20 LAB — HIV ANTIBODY (ROUTINE TESTING W REFLEX): HIV Screen 4th Generation wRfx: NONREACTIVE

## 2017-12-24 LAB — CYTOLOGY - PAP
CHLAMYDIA, DNA PROBE: NEGATIVE
Diagnosis: NEGATIVE
HPV: NOT DETECTED
NEISSERIA GONORRHEA: NEGATIVE

## 2018-01-02 ENCOUNTER — Encounter: Payer: Self-pay | Admitting: Obstetrics and Gynecology

## 2018-01-02 ENCOUNTER — Ambulatory Visit (INDEPENDENT_AMBULATORY_CARE_PROVIDER_SITE_OTHER): Payer: 59 | Admitting: Obstetrics and Gynecology

## 2018-01-02 DIAGNOSIS — Z113 Encounter for screening for infections with a predominantly sexual mode of transmission: Secondary | ICD-10-CM

## 2018-01-02 DIAGNOSIS — Z302 Encounter for sterilization: Secondary | ICD-10-CM | POA: Diagnosis not present

## 2018-01-02 NOTE — Progress Notes (Signed)
Patient ID: DELIYAH MUCKLE, female   DOB: [birth year], 37 y.o.   MRN: [id number] Preoperative History and Physical  Patricia Navarro is a 37 y.o. [id number] here for surgical management of permanent sterilization. She is currently on the Depo. She reports when she was not on Depo her periods were lite. No significant preoperative concerns.  Proposed surgery: Laproscopic tubal Ligation and umbilical hernia repair  Past Medical History:  Diagnosis Date  . Hypoglycemia    Past Surgical History:  Procedure Laterality Date  . EYE SURGERY    . TONSILLECTOMY     OB History  Gravida Para Term Preterm AB Living  SAB TAB Ectopic Multiple Live Births        0 4    # Outcome Date GA Lbr Len/2nd Weight Sex Delivery Anes PTL Lv  4 Term 10/27/15 [redacted]w[redacted]d 01:41 / 00:13 8 lb 8.9 oz (3.88 kg) M Vag-Spont EPI  LIV     Birth Comments: facial bruising  3 Term 05/28/10 [redacted]w[redacted]d  9 lb (4.082 kg) F Vag-Spont   LIV  2 Term 10/28/04 [redacted]w[redacted]d  8 lb 6 oz (3.799 kg) M Vag-Spont   LIV  1 Term 06/03/95 [redacted]w[redacted]d  6 lb 9 oz (2.977 kg) F Vag-Spont   LIV  Patient denies any other pertinent gynecologic issues.   Current Outpatient Medications on File Prior to Visit  Medication Sig Dispense Refill  . medroxyPROGESTERone (DEPO-PROVERA) 150 MG/ML injection Inject 1 mL (150 mg total) into the muscle every 3 (three) months. 1 mL 3  . albuterol (PROVENTIL HFA;VENTOLIN HFA) 108 (90 BASE) MCG/ACT inhaler Inhale 2 puffs into the lungs every 6 (six) hours as needed for wheezing or shortness of breath. (Patient not taking: Reported on 12/19/2017) 1 Inhaler 2   No current facility-administered medications on file prior to visit.    No Known Allergies  Social History:   reports that she has never smoked. She has never used smokeless tobacco. She reports that she does not drink alcohol or use drugs.  Family History  Problem Relation Age of Onset  . Thyroid disease Mother   . Heart disease Mother   . Asthma Mother   .  ADD / ADHD Son   . Heart attack Maternal Grandmother   . Thyroid disease Maternal Grandmother   . Cancer Maternal Grandfather   . Cancer Paternal Grandmother        breast  . Other Paternal Grandmother        aneursym  . Emphysema Paternal Grandfather     Review of Systems: Noncontributory  PHYSICAL EXAM: Blood pressure 100/60, pulse 98, height  (1.626 m), weight 191 lb (86.6 kg). General appearance - alert, well appearing, and in no distress Chest - clear to auscultation, no wheezes, rales or rhonchi, symmetric air entry Heart - normal rate and regular rhythm Abdomen - soft, nontender, nondistended, no masses or organomegaly                      Pelvic - examination  External genitalia - multip Vulva - normal Vagina - normal Cervix - multip  Uterus - nssc  Adnexa - negative  Extremities - peripheral pulses normal, no pedal edema, no clubbing or cyanosis  Labs: No results found for this or any previous visit (from the past 336 hour(s)).  Imaging Studies: No results found.  Assessment: Patient Active Problem List   Diagnosis Date Noted  . Abnormal  Pap smear of cervix 04/11/2016  . Elevated cholesterol 04/11/2016  . History of gestational hypertension 04/04/2016  . Cervical high risk HPV (human papillomavirus) test positive 04/04/2015  . History of shoulder dystocia in prior pregnancy, currently pregnant 03/29/2015    Plan: 1. Patient will undergo surgical management with Laproscopic tubal Ligation and umbilical hernia repair, on a Friday   By signing my name below, I, Diona Browner, attest that this documentation has been prepared under the direction and in the presence of Tilda Burrow, MD. Electronically Signed: Diona Browner, Medical Scribe. 01/02/18. 4:34 PM.  I personally performed the services described in this documentation, which was SCRIBED in my presence. The recorded information has been reviewed and considered accurate. It has been edited as  necessary during review. Tilda Burrow, MD

## 2018-01-08 LAB — GC/CHLAMYDIA PROBE AMP
CHLAMYDIA, DNA PROBE: NEGATIVE
Neisseria gonorrhoeae by PCR: NEGATIVE

## 2018-02-07 ENCOUNTER — Other Ambulatory Visit: Payer: Self-pay | Admitting: Adult Health

## 2018-02-10 ENCOUNTER — Ambulatory Visit (INDEPENDENT_AMBULATORY_CARE_PROVIDER_SITE_OTHER): Payer: 59

## 2018-02-10 VITALS — Ht 64.0 in | Wt 194.0 lb

## 2018-02-10 DIAGNOSIS — Z3202 Encounter for pregnancy test, result negative: Secondary | ICD-10-CM

## 2018-02-10 DIAGNOSIS — Z3042 Encounter for surveillance of injectable contraceptive: Secondary | ICD-10-CM | POA: Diagnosis not present

## 2018-02-10 LAB — POCT URINE PREGNANCY: Preg Test, Ur: NEGATIVE

## 2018-02-10 MED ORDER — MEDROXYPROGESTERONE ACETATE 150 MG/ML IM SUSP
150.0000 mg | Freq: Once | INTRAMUSCULAR | Status: AC
  Administered 2018-02-10: 150 mg via INTRAMUSCULAR

## 2018-02-10 NOTE — Progress Notes (Signed)
Pt here for depo injection 150 mg IM given lt deltoid. Tolerated well. Return 12 week for next injection. Pad CMA 

## 2018-02-21 ENCOUNTER — Other Ambulatory Visit: Payer: Self-pay

## 2018-02-21 ENCOUNTER — Encounter (HOSPITAL_COMMUNITY): Payer: Self-pay

## 2018-02-21 ENCOUNTER — Other Ambulatory Visit: Payer: Self-pay | Admitting: Obstetrics and Gynecology

## 2018-02-21 ENCOUNTER — Encounter (HOSPITAL_COMMUNITY)
Admission: RE | Admit: 2018-02-21 | Discharge: 2018-02-21 | Disposition: A | Discharge status: Home / Self Care | Payer: 59 | Source: Ambulatory Visit | Attending: Obstetrics and Gynecology | Admitting: Obstetrics and Gynecology

## 2018-02-21 DIAGNOSIS — Z01812 Encounter for preprocedural laboratory examination: Secondary | ICD-10-CM | POA: Diagnosis present

## 2018-02-21 HISTORY — DX: Unspecified convulsions: R56.9

## 2018-02-21 HISTORY — DX: Unspecified asthma, uncomplicated: J45.909

## 2018-02-21 LAB — CBC
HCT: 42 % (ref 36.0–46.0)
HEMOGLOBIN: 14 g/dL (ref 12.0–15.0)
MCH: 29.7 pg (ref 26.0–34.0)
MCHC: 33.3 g/dL (ref 30.0–36.0)
MCV: 89.2 fL (ref 78.0–100.0)
Platelets: 276 10*3/uL (ref 150–400)
RBC: 4.71 MIL/uL (ref 3.87–5.11)
RDW: 12.7 % (ref 11.5–15.5)
WBC: 7.6 10*3/uL (ref 4.0–10.5)

## 2018-02-21 LAB — COMPREHENSIVE METABOLIC PANEL
ALK PHOS: 58 U/L (ref 38–126)
ALT: 14 U/L (ref 0–44)
AST: 17 U/L (ref 15–41)
Albumin: 4 g/dL (ref 3.5–5.0)
Anion gap: 8 (ref 5–15)
BUN: 16 mg/dL (ref 6–20)
CALCIUM: 9.2 mg/dL (ref 8.9–10.3)
CO2: 25 mmol/L (ref 22–32)
CREATININE: 0.91 mg/dL (ref 0.44–1.00)
Chloride: 107 mmol/L (ref 98–111)
Glucose, Bld: 71 mg/dL (ref 70–99)
Potassium: 3.9 mmol/L (ref 3.5–5.1)
Sodium: 140 mmol/L (ref 135–145)
Total Bilirubin: 0.9 mg/dL (ref 0.3–1.2)
Total Protein: 7.6 g/dL (ref 6.5–8.1)

## 2018-02-21 LAB — URINALYSIS, ROUTINE W REFLEX MICROSCOPIC
Bilirubin Urine: NEGATIVE
GLUCOSE, UA: NEGATIVE mg/dL
HGB URINE DIPSTICK: NEGATIVE
Ketones, ur: NEGATIVE mg/dL
Leukocytes, UA: NEGATIVE
Nitrite: NEGATIVE
Protein, ur: NEGATIVE mg/dL
SPECIFIC GRAVITY, URINE: 1.02 (ref 1.005–1.030)
pH: 5 (ref 5.0–8.0)

## 2018-02-21 LAB — HCG, SERUM, QUALITATIVE: PREG SERUM: NEGATIVE

## 2018-02-21 NOTE — Patient Instructions (Signed)
SOPHIE TAMEZ  02/21/2018     @PREFPERIOPPHARMACY @   Your procedure is scheduled on  02/28/2018 .  Report to [facility] at  615   A.M.  Call this number if you have problems the morning of surgery:  [phone]   Remember:  Do not eat or drink after midnight.  You may drink clear liquids until  12 midnight 02/27/2018 .  Clear liquids allowed are:                    Water, Juice (non-citric and without pulp), Carbonated beverages, Clear Tea, Black Coffee only, Plain Jell-O only, Gatorade and Plain Popsicles only    Take these medicines the morning of surgery with A SIP OF WATER zyrtec. Use your inhaler before you come.    Do not wear jewelry, make-up or nail polish.  Do not wear lotions, powders, or perfumes, or deodorant.  Do not shave 48 hours prior to surgery.  Men may shave face and neck.  Do not bring valuables to the hospital.  [facility] is not responsible for any belongings or valuables.  Contacts, dentures or bridgework may not be worn into surgery.  Leave your suitcase in the car.  After surgery it may be brought to your room.  For patients admitted to the hospital, discharge time will be determined by your treatment team.  Patients discharged the day of surgery will not be allowed to drive home.   Name and phone number of your driver:   family Special instructions:  None  Please read over the following fact sheets that you were given. Anesthesia Post-op Instructions and Care and Recovery After Surgery       Open Hernia Repair, Adult Open hernia repair is a surgical procedure to fix a hernia. A hernia occurs when an internal organ or tissue pushes out through a weak spot in the abdominal wall muscles. Hernias commonly occur in the groin and around the navel. Most hernias tend to get worse over time. Often, surgery is done to prevent the hernia from becoming bigger, uncomfortable, or an emergency. Emergency surgery may be needed if  abdominal contents get stuck in the opening (incarcerated hernia) or the blood supply gets cut off (strangulated hernia). In an open repair, an incision is made in the abdomen to perform the surgery. Tell a health care provider about:  Any allergies you have.  All medicines you are taking, including vitamins, herbs, eye drops, creams, and over-the-counter medicines.  Any problems you or family members have had with anesthetic medicines.  Any blood or bone disorders you have.  Any surgeries you have had.  Any medical conditions you have, including any recent cold or flu symptoms.  Whether you are pregnant or may be pregnant. What are the risks? Generally, this is a safe procedure. However, problems may occur, including:  Long-lasting (chronic) pain.  Bleeding.  Infection.  Damage to the testicle. This can cause shrinking or swelling.  Damage to the bladder, blood vessels, intestine, or nerves near the hernia.  Trouble passing urine.  Allergic reactions to medicines.  Return of the hernia.  What happens before the procedure? Staying hydrated Follow instructions from your health care provider about hydration, which may include:  Up to 2 hours before the procedure - you may continue to drink clear liquids, such as water, clear fruit juice, black coffee, and plain tea.  Eating and drinking  restrictions Follow instructions from your health care provider about eating and drinking, which may include:  8 hours before the procedure - stop eating heavy meals or foods such as meat, fried foods, or fatty foods.  6 hours before the procedure - stop eating light meals or foods, such as toast or cereal.  6 hours before the procedure - stop drinking milk or drinks that contain milk.  2 hours before the procedure - stop drinking clear liquids.  Medicines  Ask your health care provider about: ? Changing or stopping your regular medicines. This is especially important if you are  taking diabetes medicines or blood thinners. ? Taking medicines such as aspirin and ibuprofen. These medicines can thin your blood. Do not take these medicines before your procedure if your health care provider instructs you not to.  You may be given antibiotic medicine to help prevent infection. General instructions  You may have blood tests or imaging studies.  Ask your health care provider how your surgical site will be marked or identified.  If you smoke, do not smoke for at least 2 weeks before your procedure or for as long as told by your health care provider.  Let your health care provider know if you develop a cold or any infection before your surgery.  Plan to have someone take you home from the hospital or clinic.  If you will be going home right after the procedure, plan to have someone with you for 24 hours. What happens during the procedure?  To reduce your risk of infection: ? Your health care team will wash or sanitize their hands. ? Your skin will be washed with soap. ? Hair may be removed from the surgical area.  An IV tube will be inserted into one of your veins.  You will be given one or more of the following: ? A medicine to help you relax (sedative). ? A medicine to numb the area (local anesthetic). ? A medicine to make you fall asleep (general anesthetic).  Your surgeon will make an incision over the hernia.  The tissues of the hernia will be moved back into place.  The edges of the hernia may be stitched together.  The opening in the abdominal muscles will be closed with stitches (sutures). Or, your surgeon will place a mesh patch made of manmade (synthetic) material over the opening.  The incision will be closed.  A bandage (dressing) may be placed over the incision. The procedure may vary among health care providers and hospitals. What happens after the procedure?  Your blood pressure, heart rate, breathing rate, and blood oxygen level will be  monitored until the medicines you were given have worn off.  You may be given medicine for pain.  Do not drive for 24 hours if you received a sedative. This information is not intended to replace advice given to you by your health care provider. Make sure you discuss any questions you have with your health care provider. Document Released: 01/16/2001 Document Revised: 02/10/2016 Document Reviewed: 01/04/2016 Elsevier Interactive Patient Education  2018 [employer].  Open Hernia Repair, Adult, Care After These instructions give you information about caring for yourself after your procedure. Your doctor may also give you more specific instructions. If you have problems or questions, contact your doctor. Follow these instructions at home: Surgical cut (incision) care   Follow instructions from your doctor about how to take care of your surgical cut area. Make sure you: ? Wash your hands with soap  and water before you change your bandage (dressing). If you cannot use soap and water, use hand sanitizer. ? Change your bandage as told by your doctor. ? Leave stitches (sutures), skin glue, or skin tape (adhesive) strips in place. They may need to stay in place for 2 weeks or longer. If tape strips get loose and curl up, you may trim the loose edges. Do not remove tape strips completely unless your doctor says it is okay.  Check your surgical cut every day for signs of infection. Check for: ? More redness, swelling, or pain. ? More fluid or blood. ? Warmth. ? Pus or a bad smell. Activity  Do not drive or use heavy machinery while taking prescription pain medicine. Do not drive until your doctor says it is okay.  Until your doctor says it is okay: ? Do not lift anything that is heavier than 10 lb (4.5 kg). ? Do not play contact sports.  Return to your normal activities as told by your doctor. Ask your doctor what activities are safe. General instructions  To prevent or treat having a hard  time pooping (constipation) while you are taking prescription pain medicine, your doctor may recommend that you: ? Drink enough fluid to keep your pee (urine) clear or pale yellow. ? Take over-the-counter or prescription medicines. ? Eat foods that are high in fiber, such as fresh fruits and vegetables, whole grains, and beans. ? Limit foods that are high in fat and processed sugars, such as fried and sweet foods.  Take over-the-counter and prescription medicines only as told by your doctor.  Do not take baths, swim, or use a hot tub until your doctor says it is okay.  Keep all follow-up visits as told by your doctor. This is important. Contact a doctor if:  You develop a rash.  You have more redness, swelling, or pain around your surgical cut.  You have more fluid or blood coming from your surgical cut.  Your surgical cut feels warm to the touch.  You have pus or a bad smell coming from your surgical cut.  You have a fever or chills.  You have blood in your poop (stool).  You have not pooped in 2-3 days.  Medicine does not help your pain. Get help right away if:  You have chest pain or you are short of breath.  You feel light-headed.  You feel weak and dizzy (feel faint).  You have very bad pain.  You throw up (vomit) and your pain is worse. This information is not intended to replace advice given to you by your health care provider. Make sure you discuss any questions you have with your health care provider. Document Released: 08/13/2014 Document Revised: 02/10/2016 Document Reviewed: 01/04/2016 Elsevier Interactive Patient Education  2018 [employer].  Laparoscopic Tubal Ligation Laparoscopic tubal ligation is a procedure to close the fallopian tubes. This is done so that you cannot get pregnant. When the fallopian tubes are closed, the eggs that your ovaries release cannot enter the uterus, and sperm cannot reach the released eggs. A laparoscopic tubal ligation is  sometimes called "getting your tubes tied." You should not have this procedure if you want to get pregnant someday or if you are unsure about having more children. Tell a health care provider about:  Any allergies you have.  All medicines you are taking, including vitamins, herbs, eye drops, creams, and over-the-counter medicines.  Any problems you or family members have had with anesthetic medicines.  Any  blood disorders you have.  Any surgeries you have had.  Any medical conditions you have.  Whether you are pregnant or may be pregnant.  Any past pregnancies. What are the risks? Generally, this is a safe procedure. However, problems may occur, including:  Infection.  Bleeding.  Injury to surrounding organs.  Side effects from anesthetics.  Failure of the procedure.  This procedure can increase your risk of a kind of pregnancy in which a fertilized egg attaches to the outside of the uterus (ectopic pregnancy). What happens before the procedure?  Ask your health care provider about: ? Changing or stopping your regular medicines. This is especially important if you are taking diabetes medicines or blood thinners. ? Taking medicines such as aspirin and ibuprofen. These medicines can thin your blood. Do not take these medicines before your procedure if your health care provider instructs you not to.  Follow instructions from your health care provider about eating and drinking restrictions.  Plan to have someone take you home after the procedure.  If you go home right after the procedure, plan to have someone with you for 24 hours. What happens during the procedure?  You will be given one or more of the following: ? A medicine to help you relax (sedative). ? A medicine to numb the area (local anesthetic). ? A medicine to make you fall asleep (general anesthetic). ? A medicine that is injected into an area of your body to numb everything below the injection site (regional  anesthetic).  An IV tube will be inserted into one of your veins. It will be used to give you medicines and fluids during the procedure.  Your bladder may be emptied with a small tube (catheter).  If you have been given a general anesthetic, a tube will be put down your throat to help you breathe.  Two small cuts (incisions) will be made in your lower abdomen and near your belly button.  Your abdomen will be inflated with a gas. This will let the surgeon see better and will give the surgeon room to work.  A thin, lighted tube (laparoscope) with a camera attached will be inserted into your abdomen through one of the incisions. Small instruments will be inserted through the other incision.  The fallopian tubes will be tied off, burned (cauterized), or blocked with a clip, ring, or clamp. A small portion in the center of each fallopian tube may be removed.  The gas will be released from the abdomen.  The incisions will be closed with stitches (sutures).  A bandage (dressing) will be placed over the incisions. The procedure may vary among health care providers and hospitals. What happens after the procedure?  Your blood pressure, heart rate, breathing rate, and blood oxygen level will be monitored often until the medicines you were given have worn off.  You will be given medicine to help with pain, nausea, and vomiting as needed. This information is not intended to replace advice given to you by your health care provider. Make sure you discuss any questions you have with your health care provider. Document Released: 10/29/2000 Document Revised: 12/29/2015 Document Reviewed: 07/03/2015 Elsevier Interactive Patient Education  2018 [employer].  Laparoscopic Tubal Ligation, Care After Refer to this sheet in the next few weeks. These instructions provide you with information about caring for yourself after your procedure. Your health care provider may also give you more specific  instructions. Your treatment has been planned according to current medical practices, but problems sometimes  occur. Call your health care provider if you have any problems or questions after your procedure. What can I expect after the procedure? After the procedure, it is common to have:  A sore throat.  Discomfort in your shoulder.  Mild discomfort or cramping in your abdomen.  Gas pains.  Pain or soreness in the area where the surgical cut (incision) was made.  A bloated feeling.  Tiredness.  Nausea.  Vomiting.  Follow these instructions at home: Medicines  Take over-the-counter and prescription medicines only as told by your health care provider.  Do not take aspirin because it can cause bleeding.  Do not drive or operate heavy machinery while taking prescription pain medicine. Activity  Rest for the rest of the day.  Return to your normal activities as told by your health care provider. Ask your health care provider what activities are safe for you. Incision care   Follow instructions from your health care provider about how to take care of your incision. Make sure you: ? Wash your hands with soap and water before you change your bandage (dressing). If soap and water are not available, use hand sanitizer. ? Change your dressing as told by your health care provider. ? Leave stitches (sutures) in place. They may need to stay in place for 2 weeks or longer.  Check your incision area every day for signs of infection. Check for: ? More redness, swelling, or pain. ? More fluid or blood. ? Warmth. ? Pus or a bad smell. Other Instructions  Do not take baths, swim, or use a hot tub until your health care provider approves. You may take showers.  Keep all follow-up visits as told by your health care provider. This is important.  Have someone help you with your daily household tasks for the first few days. Contact a health care provider if:  You have more redness,  swelling, or pain around your incision.  Your incision feels warm to the touch.  You have pus or a bad smell coming from your incision.  The edges of your incision break open after the sutures have been removed.  Your pain does not improve after 2-3 days.  You have a rash.  You repeatedly become dizzy or light-headed.  Your pain medicine is not helping.  You are constipated. Get help right away if:  You have a fever.  You faint.  You have increasing pain in your abdomen.  You have severe pain in one or both of your shoulders.  You have fluid or blood coming from your sutures or from your vagina.  You have shortness of breath or difficulty breathing.  You have chest pain or leg pain.  You have ongoing nausea, vomiting, or diarrhea. This information is not intended to replace advice given to you by your health care provider. Make sure you discuss any questions you have with your health care provider. Document Released: 02/09/2005 Document Revised: 12/26/2015 Document Reviewed: 07/03/2015 Elsevier Interactive Patient Education  2018 [employer].  General Anesthesia, Adult General anesthesia is the use of medicines to make a person "go to sleep" (be unconscious) for a medical procedure. General anesthesia is often recommended when a procedure:  Is long.  Requires you to be still or in an unusual position.  Is major and can cause you to lose blood.  Is impossible to do without general anesthesia.  The medicines used for general anesthesia are called general anesthetics. In addition to making you sleep, the medicines:  Prevent pain.  Control your blood pressure.  Relax your muscles.  Tell a health care provider about:  Any allergies you have.  All medicines you are taking, including vitamins, herbs, eye drops, creams, and over-the-counter medicines.  Any problems you or family members have had with anesthetic medicines.  Types of anesthetics you have had  in the past.  Any bleeding disorders you have.  Any surgeries you have had.  Any medical conditions you have.  Any history of heart or lung conditions, such as heart failure, sleep apnea, or chronic obstructive pulmonary disease (COPD).  Whether you are pregnant or may be pregnant.  Whether you use tobacco, alcohol, marijuana, or street drugs.  Any history of [employer].  Any history of depression or anxiety. What are the risks? Generally, this is a safe procedure. However, problems may occur, including:  Allergic reaction to anesthetics.  Lung and heart problems.  Inhaling food or liquids from your stomach into your lungs (aspiration).  Injury to nerves.  Waking up during your procedure and being unable to move (rare).  Extreme agitation or a state of mental confusion (delirium) when you wake up from the anesthetic.  Air in the bloodstream, which can lead to stroke.  These problems are more likely to develop if you are having a major surgery or if you have an advanced medical condition. You can prevent some of these complications by answering all of your health care provider's questions thoroughly and by following all pre-procedure instructions. General anesthesia can cause side effects, including:  Nausea or vomiting  A sore throat from the breathing tube.  Feeling cold or shivery.  Feeling tired, washed out, or achy.  Sleepiness or drowsiness.  Confusion or agitation.  What happens before the procedure? Staying hydrated Follow instructions from your health care provider about hydration, which may include:  Up to 2 hours before the procedure - you may continue to drink clear liquids, such as water, clear fruit juice, black coffee, and plain tea.  Eating and drinking restrictions Follow instructions from your health care provider about eating and drinking, which may include:  8 hours before the procedure - stop eating heavy meals or foods such as meat,  fried foods, or fatty foods.  6 hours before the procedure - stop eating light meals or foods, such as toast or cereal.  6 hours before the procedure - stop drinking milk or drinks that contain milk.  2 hours before the procedure - stop drinking clear liquids.  Medicines  Ask your health care provider about: ? Changing or stopping your regular medicines. This is especially important if you are taking diabetes medicines or blood thinners. ? Taking medicines such as aspirin and ibuprofen. These medicines can thin your blood. Do not take these medicines before your procedure if your health care provider instructs you not to. ? Taking new dietary supplements or medicines. Do not take these during the week before your procedure unless your health care provider approves them.  If you are told to take a medicine or to continue taking a medicine on the day of the procedure, take the medicine with sips of water. General instructions   Ask if you will be going home the same day, the following day, or after a longer hospital stay. ? Plan to have someone take you home. ? Plan to have someone stay with you for the first 24 hours after you leave the hospital or clinic.  For 3-6 weeks before the procedure, try not to use any  tobacco products, such as cigarettes, chewing tobacco, and e-cigarettes.  You may brush your teeth on the morning of the procedure, but make sure to spit out the toothpaste. What happens during the procedure?  You will be given anesthetics through a mask and through an IV tube in one of your veins.  You may receive medicine to help you relax (sedative).  As soon as you are asleep, a breathing tube may be used to help you breathe.  An anesthesia specialist will stay with you throughout the procedure. He or she will help keep you comfortable and safe by continuing to give you medicines and adjusting the amount of medicine that you get. He or she will also watch your blood  pressure, pulse, and oxygen levels to make sure that the anesthetics do not cause any problems.  If a breathing tube was used to help you breathe, it will be removed before you wake up. The procedure may vary among health care providers and hospitals. What happens after the procedure?  You will wake up, often slowly, after the procedure is complete, usually in a recovery area.  Your blood pressure, heart rate, breathing rate, and blood oxygen level will be monitored until the medicines you were given have worn off.  You may be given medicine to help you calm down if you feel anxious or agitated.  If you will be going home the same day, your health care provider may check to make sure you can stand, drink, and urinate.  Your health care providers will treat your pain and side effects before you go home.  Do not drive for 24 hours if you received a sedative.  You may: ? Feel nauseous and vomit. ? Have a sore throat. ? Have mental slowness. ? Feel cold or shivery. ? Feel sleepy. ? Feel tired. ? Feel sore or achy, even in parts of your body where you did not have surgery. This information is not intended to replace advice given to you by your health care provider. Make sure you discuss any questions you have with your health care provider. Document Released: 10/30/2007 Document Revised: 01/03/2016 Document Reviewed: 07/07/2015 Elsevier Interactive Patient Education  2018 [employer]. General Anesthesia, Adult, Care After These instructions provide you with information about caring for yourself after your procedure. Your health care provider may also give you more specific instructions. Your treatment has been planned according to current medical practices, but problems sometimes occur. Call your health care provider if you have any problems or questions after your procedure. What can I expect after the procedure? After the procedure, it is common to have:  Vomiting.  A sore  throat.  Mental slowness.  It is common to feel:  Nauseous.  Cold or shivery.  Sleepy.  Tired.  Sore or achy, even in parts of your body where you did not have surgery.  Follow these instructions at home: For at least 24 hours after the procedure:  Do not: ? Participate in activities where you could fall or become injured. ? Drive. ? Use heavy machinery. ? Drink alcohol. ? Take sleeping pills or medicines that cause drowsiness. ? Make important decisions or sign legal documents. ? Take care of children on your own.  Rest. Eating and drinking  If you vomit, drink water, juice, or soup when you can drink without vomiting.  Drink enough fluid to keep your urine clear or pale yellow.  Make sure you have little or no nausea before eating solid foods.  Follow the diet recommended by your health care provider. General instructions  Have a responsible adult stay with you until you are awake and alert.  Return to your normal activities as told by your health care provider. Ask your health care provider what activities are safe for you.  Take over-the-counter and prescription medicines only as told by your health care provider.  If you smoke, do not smoke without supervision.  Keep all follow-up visits as told by your health care provider. This is important. Contact a health care provider if:  You continue to have nausea or vomiting at home, and medicines are not helpful.  You cannot drink fluids or start eating again.  You cannot urinate after 8-12 hours.  You develop a skin rash.  You have fever.  You have increasing redness at the site of your procedure. Get help right away if:  You have difficulty breathing.  You have chest pain.  You have unexpected bleeding.  You feel that you are having a life-threatening or urgent problem. This information is not intended to replace advice given to you by your health care provider. Make sure you discuss any  questions you have with your health care provider. Document Released: 10/29/2000 Document Revised: 12/26/2015 Document Reviewed: 07/07/2015 Elsevier Interactive Patient Education  [employer]2018 Elsevier Inc.

## 2018-02-28 ENCOUNTER — Encounter (HOSPITAL_COMMUNITY)
Admission: RE | Disposition: A | Discharge status: Home / Self Care | Payer: Self-pay | Source: Ambulatory Visit | Attending: Obstetrics and Gynecology

## 2018-02-28 ENCOUNTER — Encounter (HOSPITAL_COMMUNITY): Payer: Self-pay | Admitting: *Deleted

## 2018-02-28 ENCOUNTER — Other Ambulatory Visit: Payer: Self-pay

## 2018-02-28 ENCOUNTER — Ambulatory Visit (HOSPITAL_COMMUNITY)
Admission: RE | Admit: 2018-02-28 | Discharge: 2018-02-28 | Disposition: A | Discharge status: Home / Self Care | Payer: 59 | Source: Ambulatory Visit | Attending: Obstetrics and Gynecology | Admitting: Obstetrics and Gynecology

## 2018-02-28 ENCOUNTER — Ambulatory Visit (HOSPITAL_COMMUNITY): Payer: 59 | Admitting: Anesthesiology

## 2018-02-28 DIAGNOSIS — Z302 Encounter for sterilization: Secondary | ICD-10-CM | POA: Insufficient documentation

## 2018-02-28 DIAGNOSIS — Z79899 Other long term (current) drug therapy: Secondary | ICD-10-CM | POA: Insufficient documentation

## 2018-02-28 DIAGNOSIS — K429 Umbilical hernia without obstruction or gangrene: Secondary | ICD-10-CM | POA: Diagnosis not present

## 2018-02-28 DIAGNOSIS — E7800 Pure hypercholesterolemia, unspecified: Secondary | ICD-10-CM | POA: Insufficient documentation

## 2018-02-28 HISTORY — PX: TUBAL LIGATION: SHX77

## 2018-02-28 SURGERY — LIGATION, FALLOPIAN TUBE, BILATERAL
Anesthesia: General | Site: Abdomen | Laterality: Bilateral

## 2018-02-28 MED ORDER — ONDANSETRON HCL 4 MG/2ML IJ SOLN
INTRAMUSCULAR | Status: DC | PRN
  Administered 2018-02-28: 4 mg via INTRAVENOUS

## 2018-02-28 MED ORDER — MIDAZOLAM HCL 5 MG/5ML IJ SOLN
INTRAMUSCULAR | Status: DC | PRN
  Administered 2018-02-28: 2 mg via INTRAVENOUS

## 2018-02-28 MED ORDER — LACTATED RINGERS IV SOLN
INTRAVENOUS | Status: DC
  Administered 2018-02-28: 07:00:00 via INTRAVENOUS

## 2018-02-28 MED ORDER — NEOSTIGMINE METHYLSULFATE 10 MG/10ML IV SOLN
INTRAVENOUS | Status: AC
  Filled 2018-02-28: qty 1

## 2018-02-28 MED ORDER — LIDOCAINE HCL (CARDIAC) PF 50 MG/5ML IV SOSY
PREFILLED_SYRINGE | INTRAVENOUS | Status: DC | PRN
  Administered 2018-02-28: 40 mg via INTRAVENOUS

## 2018-02-28 MED ORDER — ROCURONIUM BROMIDE 50 MG/5ML IV SOLN
INTRAVENOUS | Status: AC
  Filled 2018-02-28: qty 1

## 2018-02-28 MED ORDER — NEOSTIGMINE METHYLSULFATE 10 MG/10ML IV SOLN
INTRAVENOUS | Status: DC | PRN
  Administered 2018-02-28: 4 mg via INTRAVENOUS

## 2018-02-28 MED ORDER — FENTANYL CITRATE (PF) 100 MCG/2ML IJ SOLN
INTRAMUSCULAR | Status: DC | PRN
  Administered 2018-02-28 (×4): 50 ug via INTRAVENOUS

## 2018-02-28 MED ORDER — TRAMADOL HCL 50 MG PO TABS: 50.0000 mg | ORAL_TABLET | Freq: Four times a day (QID) | ORAL | 0 refills | Status: DC | PRN

## 2018-02-28 MED ORDER — SUCCINYLCHOLINE CHLORIDE 20 MG/ML IJ SOLN
INTRAMUSCULAR | Status: AC
  Filled 2018-02-28: qty 1

## 2018-02-28 MED ORDER — FENTANYL CITRATE (PF) 250 MCG/5ML IJ SOLN
INTRAMUSCULAR | Status: AC
  Filled 2018-02-28: qty 5

## 2018-02-28 MED ORDER — MIDAZOLAM HCL 2 MG/2ML IJ SOLN
INTRAMUSCULAR | Status: AC
  Filled 2018-02-28: qty 2

## 2018-02-28 MED ORDER — MEPERIDINE HCL 50 MG/ML IJ SOLN: 6.2500 mg | INTRAMUSCULAR | Status: DC | PRN

## 2018-02-28 MED ORDER — POVIDONE-IODINE 10 % EX OINT
TOPICAL_OINTMENT | CUTANEOUS | Status: AC
  Filled 2018-02-28: qty 1

## 2018-02-28 MED ORDER — BUPIVACAINE HCL (PF) 0.25 % IJ SOLN
INTRAMUSCULAR | Status: AC
  Filled 2018-02-28: qty 30

## 2018-02-28 MED ORDER — ROCURONIUM 10MG/ML (10ML) SYRINGE FOR MEDFUSION PUMP - OPTIME
INTRAVENOUS | Status: DC | PRN
  Administered 2018-02-28: 30 mg via INTRAVENOUS

## 2018-02-28 MED ORDER — LIDOCAINE HCL (PF) 1 % IJ SOLN
INTRAMUSCULAR | Status: AC
  Filled 2018-02-28: qty 10

## 2018-02-28 MED ORDER — BUPIVACAINE HCL (PF) 0.25 % IJ SOLN
INTRAMUSCULAR | Status: DC | PRN
Start: 2018-02-28 — End: 2018-02-28
  Administered 2018-02-28: 10 mL

## 2018-02-28 MED ORDER — HYDROMORPHONE HCL 1 MG/ML IJ SOLN
0.2500 mg | INTRAMUSCULAR | Status: DC | PRN
  Administered 2018-02-28: 0.5 mg via INTRAVENOUS
  Filled 2018-02-28: qty 0.5

## 2018-02-28 MED ORDER — PROPOFOL 10 MG/ML IV BOLUS
INTRAVENOUS | Status: DC | PRN
  Administered 2018-02-28: 170 mg via INTRAVENOUS

## 2018-02-28 MED ORDER — 0.9 % SODIUM CHLORIDE (POUR BTL) OPTIME
TOPICAL | Status: DC | PRN
  Administered 2018-02-28: 1000 mL

## 2018-02-28 MED ORDER — GLYCOPYRROLATE 0.2 MG/ML IJ SOLN
INTRAMUSCULAR | Status: DC | PRN
  Administered 2018-02-28: .6 mg via INTRAVENOUS

## 2018-02-28 MED ORDER — PROPOFOL 10 MG/ML IV BOLUS
INTRAVENOUS | Status: AC
  Filled 2018-02-28: qty 40

## 2018-02-28 MED ORDER — GLYCOPYRROLATE 0.2 MG/ML IJ SOLN
INTRAMUSCULAR | Status: AC
  Filled 2018-02-28: qty 3

## 2018-02-28 MED ORDER — ACETAMINOPHEN 10 MG/ML IV SOLN: 1000.0000 mg | Freq: Once | INTRAVENOUS | Status: DC | PRN

## 2018-02-28 MED ORDER — HYDROCODONE-ACETAMINOPHEN 7.5-325 MG PO TABS: 1.0000 | ORAL_TABLET | Freq: Once | ORAL | Status: DC | PRN

## 2018-02-28 MED ORDER — ONDANSETRON HCL 4 MG/2ML IJ SOLN: 4.0000 mg | Freq: Once | INTRAMUSCULAR | Status: DC | PRN

## 2018-02-28 SURGICAL SUPPLY — 51 items
ADH SKN CLS APL DERMABOND .7 (GAUZE/BANDAGES/DRESSINGS) ×2
BANDAGE STRIP 1X3 FLEXIBLE (GAUZE/BANDAGES/DRESSINGS) ×8 IMPLANT
BLADE SURG SZ11 CARB STEEL (BLADE) ×4 IMPLANT
CATH ROBINSON RED A/P 16FR (CATHETERS) ×3 IMPLANT
CLOSURE WOUND 1/4 X3 (GAUZE/BANDAGES/DRESSINGS) ×2
CLOTH BEACON ORANGE TIMEOUT ST (SAFETY) ×4 IMPLANT
COVER LIGHT HANDLE STERIS (MISCELLANEOUS) ×8 IMPLANT
DECANTER SPIKE VIAL GLASS SM (MISCELLANEOUS) ×4 IMPLANT
DERMABOND ADVANCED (GAUZE/BANDAGES/DRESSINGS) ×2
DERMABOND ADVANCED .7 DNX12 (GAUZE/BANDAGES/DRESSINGS) ×1 IMPLANT
DURAPREP 26ML APPLICATOR (WOUND CARE) ×4 IMPLANT
ELECT REM PT RETURN 9FT ADLT (ELECTROSURGICAL) ×4
ELECTRODE REM PT RTRN 9FT ADLT (ELECTROSURGICAL) ×2 IMPLANT
FORMALIN 10 PREFIL 120ML (MISCELLANEOUS) ×4 IMPLANT
GLOVE BIOGEL PI IND STRL 7.0 (GLOVE) ×5 IMPLANT
GLOVE BIOGEL PI IND STRL 9 (GLOVE) ×2 IMPLANT
GLOVE BIOGEL PI INDICATOR 7.0 (GLOVE) ×6
GLOVE BIOGEL PI INDICATOR 9 (GLOVE) ×2
GLOVE ECLIPSE 9.0 STRL (GLOVE) ×4 IMPLANT
GOWN SPEC L3 XXLG W/TWL (GOWN DISPOSABLE) ×5 IMPLANT
GOWN STRL REUS W/TWL LRG LVL3 (GOWN DISPOSABLE) ×4 IMPLANT
INST SET LAPROSCOPIC GYN AP (KITS) ×4 IMPLANT
INST SET MINOR GENERAL (KITS) ×4 IMPLANT
KIT TURNOVER CYSTO (KITS) ×4 IMPLANT
KIT TURNOVER KIT A (KITS) ×4 IMPLANT
MANIFOLD NEPTUNE II (INSTRUMENTS) ×4 IMPLANT
NDL HYPO 25X1 1.5 SAFETY (NEEDLE) ×1 IMPLANT
NDL INSUFFLATION 14GA 120MM (NEEDLE) ×1 IMPLANT
NEEDLE HYPO 25X1 1.5 SAFETY (NEEDLE) ×4 IMPLANT
NEEDLE INSUFFLATION 14GA 120MM (NEEDLE) ×4 IMPLANT
NS IRRIG 1000ML POUR BTL (IV SOLUTION) ×4 IMPLANT
PACK MINOR (CUSTOM PROCEDURE TRAY) ×4 IMPLANT
PACK PERI GYN (CUSTOM PROCEDURE TRAY) ×4 IMPLANT
PAD ARMBOARD 7.5X6 YLW CONV (MISCELLANEOUS) ×4 IMPLANT
RING FALLOPIAN BANDS (Ring) ×4 IMPLANT
SET BASIN LINEN APH (SET/KITS/TRAYS/PACK) ×4 IMPLANT
SOL PREP PROV IODINE SCRUB 4OZ (MISCELLANEOUS) ×4 IMPLANT
SOLUTION ANTI FOG 6CC (MISCELLANEOUS) ×4 IMPLANT
SPONGE GAUZE 2X2 8PLY STER LF (GAUZE/BANDAGES/DRESSINGS) ×1
SPONGE GAUZE 2X2 8PLY STRL LF (GAUZE/BANDAGES/DRESSINGS) ×3 IMPLANT
STAPLER VISISTAT (STAPLE) ×4 IMPLANT
STRIP CLOSURE SKIN 1/4X3 (GAUZE/BANDAGES/DRESSINGS) ×5 IMPLANT
SUT PROLENE 2 0 SH 30 (SUTURE) ×3 IMPLANT
SUT VIC AB 4-0 PS2 27 (SUTURE) ×4 IMPLANT
SYR 10ML LL (SYRINGE) ×8 IMPLANT
SYR BULB IRRIGATION 50ML (SYRINGE) ×4 IMPLANT
SYR CONTROL 10ML LL (SYRINGE) ×4 IMPLANT
TROCAR KII 8X100ML NONTHREADED (TROCAR) ×4 IMPLANT
TROCAR XCEL NON-BLD 5MMX100MML (ENDOMECHANICALS) ×4 IMPLANT
TUBING INSUFFLATION (TUBING) ×4 IMPLANT
WARMER LAPAROSCOPE (MISCELLANEOUS) ×4 IMPLANT

## 2018-02-28 NOTE — Anesthesia Postprocedure Evaluation (Signed)
Anesthesia Post Note  Patient: Jacklynn BueJennifer S Racca  Procedure(s) Performed: LAPAROSCOPIC BILATERAL TUBAL LIGATION (Bilateral Abdomen)  Patient location during evaluation: PACU Anesthesia Type: General Level of consciousness: awake and alert and oriented Pain management: pain level controlled Vital Signs Assessment: post-procedure vital signs reviewed and stable Respiratory status: spontaneous breathing Cardiovascular status: blood pressure returned to baseline and stable Postop Assessment: no apparent nausea or vomiting Anesthetic complications: no     Last Vitals:  Vitals:   02/28/18 0840 02/28/18 0845  BP:    Pulse:    Resp:    Temp:    SpO2: 100% 100%    Last Pain:  Vitals:   02/28/18 0833  TempSrc:   PainSc: Asleep                 Torence Palmeri

## 2018-02-28 NOTE — Anesthesia Preprocedure Evaluation (Signed)
Anesthesia Evaluation  Patient identified by MRN, date of birth, ID band Patient awake    Reviewed: Allergy & Precautions, H&P , NPO status , Patient's Chart, lab work & pertinent test results, reviewed documented beta blocker date and time   Airway Mallampati: I  TM Distance: >3 FB Neck ROM: full    Dental no notable dental hx.    Pulmonary neg pulmonary ROS, asthma ,    Pulmonary exam normal breath sounds clear to auscultation       Cardiovascular Exercise Tolerance: Good negative cardio ROS   Rhythm:regular Rate:Normal     Neuro/Psych Seizures -,  negative neurological ROS  negative psych ROS   GI/Hepatic negative GI ROS, Neg liver ROS,   Endo/Other  negative endocrine ROS  Renal/GU negative Renal ROS  negative genitourinary   Musculoskeletal negative musculoskeletal ROS (+)   Abdominal   Peds negative pediatric ROS (+)  Hematology negative hematology ROS (+)   Anesthesia Other Findings   Reproductive/Obstetrics negative OB ROS                             Anesthesia Physical Anesthesia Plan  ASA: II  Anesthesia Plan: General   Post-op Pain Management:    Induction:   PONV Risk Score and Plan:   Airway Management Planned:   Additional Equipment:   Intra-op Plan:   Post-operative Plan:   Informed Consent: I have reviewed the patients History and Physical, chart, labs and discussed the procedure including the risks, benefits and alternatives for the proposed anesthesia with the patient or authorized representative who has indicated his/her understanding and acceptance.   Dental Advisory Given  Plan Discussed with: CRNA  Anesthesia Plan Comments:         Anesthesia Quick Evaluation

## 2018-02-28 NOTE — Transfer of Care (Signed)
Immediate Anesthesia Transfer of Care Note  Patient: Patricia Navarro  Procedure(s) Performed: LAPAROSCOPIC BILATERAL TUBAL LIGATION (Bilateral Abdomen)  Patient Location: PACU  Anesthesia Type:General  Level of Consciousness: awake, alert  and oriented  Airway & Oxygen Therapy: Patient Spontanous Breathing  Post-op Assessment: Report given to RN  Post vital signs: Reviewed and stable  Last Vitals:  Vitals Value Taken Time  BP 113/78 02/28/2018  8:33 AM  Temp    Pulse    Resp 12 02/28/2018  8:35 AM  SpO2    Vitals shown include unvalidated device data.  Last Pain:  Vitals:   02/28/18 0641  TempSrc: Oral  PainSc: 0-No pain         Complications: No apparent anesthesia complications

## 2018-02-28 NOTE — Interval H&P Note (Signed)
History and Physical Interval Note:  02/28/2018 7:21 AM  Patricia Navarro  has presented today for surgery, with the diagnosis of Sterilization  The various methods of treatment have been discussed with the patient and family. After consideration of risks, benefits and other options for treatment, the patient has consented to  Procedure(s): LAPAROSCOPIC BILATERAL TUBAL LIGATION (Bilateral) HERNIA REPAIR UMBILICAL ADULT (N/A) as a surgical intervention .  The patient's history has been reviewed, patient examined, no change in status, stable for surgery.  I have reviewed the patient's chart and labs.  Questions were answered to the patient's satisfaction.  SHE Specifically denied interest in endometrial ablation, as well as salpingectomy. She prefers a simple plan, with tubal ligation with fallope rings planned, then hernia repair.   Tilda BurrowJohn V Tinzley Dalia

## 2018-02-28 NOTE — Op Note (Signed)
Please see the brief op note for details. 

## 2018-02-28 NOTE — H&P (Signed)
Progress Notes by Patricia Burrow, MD at 01/02/2018 4:00 PM   Author: Tilda Burrow, MD Author Type: Physician Filed: 01/02/2018 6:50 PM  Note Status: Signed Cosign: Cosign Not Required Encounter Date: 01/02/2018  Editor: Patricia Burrow, MD (Physician)  Prior Versions: 1. Patricia Navarro at 01/02/2018 4:52 PM - Shared  Expand All Collapse All   Patient ID: Patricia Navarro, female   DOB: [birth year], 37 y.o.   MRN: [id number] Preoperative History and Physical  Patricia Navarro is a 37 y.o. [id number] here for surgical management of permanent sterilization. She is currently on the Depo. She reports when she was not on Depo her periods were lite. No significant preoperative concerns.  Proposed surgery: Laproscopic tubal Ligation and umbilical hernia repair      Past Medical History:  Diagnosis Date  . Hypoglycemia         Past Surgical History:  Procedure Laterality Date  . EYE SURGERY    . TONSILLECTOMY                     OB History  Gravida Para Term Preterm AB Living  4 4 4     4   SAB TAB Ectopic Multiple Live Births           0 4       # Outcome Date GA Lbr Len/2nd Weight Sex Delivery Anes PTL Lv  4 Term 10/27/15 [redacted]w[redacted]d 01:41 / 00:13 8 lb 8.9 oz (3.88 kg) M Vag-Spont EPI  LIV     Birth Comments: facial bruising  3 Term 05/28/10 [redacted]w[redacted]d  9 lb (4.082 kg) F Vag-Spont   LIV  2 Term 10/28/04 [redacted]w[redacted]d  8 lb 6 oz (3.799 kg) M Vag-Spont   LIV  1 Term 06/03/95 [redacted]w[redacted]d  6 lb 9 oz (2.977 kg) F Vag-Spont   LIV  Patient denies any other pertinent gynecologic issues.         Current Outpatient Medications on File Prior to Visit  Medication Sig Dispense Refill  . medroxyPROGESTERone (DEPO-PROVERA) 150 MG/ML injection Inject 1 mL (150 mg total) into the muscle every 3 (three) months. 1 mL 3  . albuterol (PROVENTIL HFA;VENTOLIN HFA) 108 (90 BASE) MCG/ACT inhaler Inhale 2 puffs into the lungs every 6 (six) hours as needed for wheezing or shortness of breath. (Patient  not taking: Reported on 12/19/2017) 1 Inhaler 2   No current facility-administered medications on file prior to visit.    No Known Allergies  Social History:   reports that she has never smoked. She has never used smokeless tobacco. She reports that she does not drink alcohol or use drugs.       Family History  Problem Relation Age of Onset  . Thyroid disease Mother   . Heart disease Mother   . Asthma Mother   . ADD / ADHD Son   . Heart attack Maternal Grandmother   . Thyroid disease Maternal Grandmother   . Cancer Maternal Grandfather   . Cancer Paternal Grandmother        breast  . Other Paternal Grandmother        aneursym  . Emphysema Paternal Grandfather     Review of Systems: Noncontributory  PHYSICAL EXAM: Blood pressure 100/60, pulse 98, height 5\' 4"  (1.626 m), weight 191 lb (86.6 kg). General appearance - alert, well appearing, and in no distress Chest - clear to auscultation, no wheezes, rales or rhonchi, symmetric air entry Heart - normal rate and regular rhythm  Abdomen - soft, nontender, nondistended, no masses or organomegaly, there is a small 1 cm tender palpable defect just to the right of the center of the navel, at approximately 10:00, that will be identified during entry and repaired on exit .                       Pelvic - examination  External genitalia - multip Vulva - normal Vagina - normal Cervix - multip  Uterus - nssc  Adnexa - negative  Extremities - peripheral pulses normal, no pedal edema, no clubbing or cyanosis  Labs: CBC    Component Value Date/Time   WBC 7.6 02/21/2018 0834   RBC 4.71 02/21/2018 0834   HGB 14.0 02/21/2018 0834   HGB 13.3 04/06/2016 1332   HCT 42.0 02/21/2018 0834   HCT 38.9 04/06/2016 1332   PLT 276 02/21/2018 0834   PLT 315 04/06/2016 1332   MCV 89.2 02/21/2018 0834   MCV 84 04/06/2016 1332   MCH 29.7 02/21/2018 0834   MCHC 33.3 02/21/2018 0834   RDW 12.7 02/21/2018 0834   RDW 14.9  04/06/2016 1332   CMP Latest Ref Rng & Units 02/21/2018 04/06/2016 10/26/2015  Glucose 70 - 99 mg/dL 71 79 74  BUN 6 - 20 mg/dL 16 15 8   Creatinine 0.44 - 1.00 mg/dL 1.610.91 0.96(E1.01(H) 4.540.55  Sodium 135 - 145 mmol/L 140 141 134(L)  Potassium 3.5 - 5.1 mmol/L 3.9 4.0 3.8  Chloride 98 - 111 mmol/L 107 102 106  CO2 22 - 32 mmol/L 25 22 20(L)  Calcium 8.9 - 10.3 mg/dL 9.2 9.0 0.9(W8.3(L)  Total Protein 6.5 - 8.1 g/dL 7.6 7.1 1.1(B6.4(L)  Total Bilirubin 0.3 - 1.2 mg/dL 0.9 1.1 0.5  Alkaline Phos 38 - 126 U/L 58 68 111  AST 15 - 41 U/L 17 16 17   ALT 0 - 44 U/L 14 9 11(L)      Imaging Studies: ImagingResults  No results found.   Assessment:     Patient Active Problem List   Diagnosis Date Noted  . Abnormal Pap smear of cervix 04/11/2016  . Elevated cholesterol 04/11/2016  . History of gestational hypertension 04/04/2016  . Cervical high risk HPV (human papillomavirus) test positive 04/04/2015  . History of shoulder dystocia in prior pregnancy, currently pregnant 03/29/2015    Plan: 1. Patient will undergo surgical management with Laproscopic tubal Ligation and umbilical hernia repair, on  Friday 02/28/2018 2.    By signing my name below, I, Patricia BrownerJennifer Navarro, attest that this documentation has been prepared under the direction and in the presence of Patricia BurrowFerguson, Patricia Azzarello V, MD. Electronically Signed: Diona BrownerJennifer Navarro, Medical Scribe. 01/02/18. 4:34 PM.  I personally performed the services described in this documentation, which was SCRIBED in my presence. The recorded information has been reviewed and considered accurate. It has been edited as necessary during review. Patricia BurrowJohn Navarro Lache Dagher, MD

## 2018-02-28 NOTE — Discharge Instructions (Addendum)
Laparoscopic Tubal Ligation, Care After °Refer to this sheet in the next few weeks. These instructions provide you with information about caring for yourself after your procedure. Your health care provider may also give you more specific instructions. Your treatment has been planned according to current medical practices, but problems sometimes occur. Call your health care provider if you have any problems or questions after your procedure. °What can I expect after the procedure? °After the procedure, it is common to have: °· A sore throat. °· Discomfort in your shoulder. °· Mild discomfort or cramping in your abdomen. °· Gas pains. °· Pain or soreness in the area where the surgical cut (incision) was made. °· A bloated feeling. °· Tiredness. °· Nausea. °· Vomiting. ° °Follow these instructions at home: °Medicines °· Take over-the-counter and prescription medicines only as told by your health care provider. °· Do not take aspirin because it can cause bleeding. °· Do not drive or operate heavy machinery while taking prescription pain medicine. °Activity °· Rest for the rest of the day. °· Return to your normal activities as told by your health care provider. Ask your health care provider what activities are safe for you. °Incision care ° °· Follow instructions from your health care provider about how to take care of your incision. Make sure you: °? Wash your hands with soap and water before you change your bandage (dressing). If soap and water are not available, use hand sanitizer. °? Change your dressing as told by your health care provider. °? Leave stitches (sutures) in place. They may need to stay in place for 2 weeks or longer. °· Check your incision area every day for signs of infection. Check for: °? More redness, swelling, or pain. °? More fluid or blood. °? Warmth. °? Pus or a bad smell. °Other Instructions °· Do not take baths, swim, or use a hot tub until your health care provider approves. You may take  showers. °· Keep all follow-up visits as told by your health care provider. This is important. °· Have someone help you with your daily household tasks for the first few days. °Contact a health care provider if: °· You have more redness, swelling, or pain around your incision. °· Your incision feels warm to the touch. °· You have pus or a bad smell coming from your incision. °· The edges of your incision break open after the sutures have been removed. °· Your pain does not improve after 2-3 days. °· You have a rash. °· You repeatedly become dizzy or light-headed. °· Your pain medicine is not helping. °· You are constipated. °Get help right away if: °· You have a fever. °· You faint. °· You have increasing pain in your abdomen. °· You have severe pain in one or both of your shoulders. °· You have fluid or blood coming from your sutures or from your vagina. °· You have shortness of breath or difficulty breathing. °· You have chest pain or leg pain. °· You have ongoing nausea, vomiting, or diarrhea. °This information is not intended to replace advice given to you by your health care provider. Make sure you discuss any questions you have with your health care provider. °Document Released: 02/09/2005 Document Revised: 12/26/2015 Document Reviewed: 07/03/2015 °Elsevier Interactive Patient Education © 2018 Elsevier Inc. ° ° ° °General Anesthesia, Adult, Care After °These instructions provide you with information about caring for yourself after your procedure. Your health care provider may also give you more specific instructions. Your treatment has   been planned according to current medical practices, but problems sometimes occur. Call your health care provider if you have any problems or questions after your procedure. °What can I expect after the procedure? °After the procedure, it is common to have: °· Vomiting. °· A sore throat. °· Mental slowness. ° °It is common to feel: °· Nauseous. °· Cold or  shivery. °· Sleepy. °· Tired. °· Sore or achy, even in parts of your body where you did not have surgery. ° °Follow these instructions at home: °For at least 24 hours after the procedure: °· Do not: °? Participate in activities where you could fall or become injured. °? Drive. °? Use heavy machinery. °? Drink alcohol. °? Take sleeping pills or medicines that cause drowsiness. °? Make important decisions or sign legal documents. °? Take care of children on your own. °· Rest. °Eating and drinking °· If you vomit, drink water, juice, or soup when you can drink without vomiting. °· Drink enough fluid to keep your urine clear or pale yellow. °· Make sure you have little or no nausea before eating solid foods. °· Follow the diet recommended by your health care provider. °General instructions °· Have a responsible adult stay with you until you are awake and alert. °· Return to your normal activities as told by your health care provider. Ask your health care provider what activities are safe for you. °· Take over-the-counter and prescription medicines only as told by your health care provider. °· If you smoke, do not smoke without supervision. °· Keep all follow-up visits as told by your health care provider. This is important. °Contact a health care provider if: °· You continue to have nausea or vomiting at home, and medicines are not helpful. °· You cannot drink fluids or start eating again. °· You cannot urinate after 8-12 hours. °· You develop a skin rash. °· You have fever. °· You have increasing redness at the site of your procedure. °Get help right away if: °· You have difficulty breathing. °· You have chest pain. °· You have unexpected bleeding. °· You feel that you are having a life-threatening or urgent problem. °This information is not intended to replace advice given to you by your health care provider. Make sure you discuss any questions you have with your health care provider. °Document Released: 10/29/2000  Document Revised: 12/26/2015 Document Reviewed: 07/07/2015 °Elsevier Interactive Patient Education © 2018 Else[employer] Inc. ° °

## 2018-02-28 NOTE — Op Note (Signed)
02/28/2018  8:25 AM  PATIENT:  Patricia Navarro  37 y.o. female  PRE-OPERATIVE DIAGNOSIS:  Sterilization  POST-OPERATIVE DIAGNOSIS:  desires permanent sterilization, small umbilical hernia  PROCEDURE:  Procedure(s): LAPAROSCOPIC BILATERAL TUBAL LIGATION (Bilateral) HERNIA REPAIR UMBILICAL ADULT (N/A) no hernia noted  SURGEON:  Surgeon(s) and Role:    Tilda Burrow* Marvens Hollars V, MD - Primary  PHYSICIAN ASSISTANT:   ASSISTANTS: none   ANESTHESIA:   general  EBL:  5 mL   BLOOD ADMINISTERED:none  DRAINS: none   LOCAL MEDICATIONS USED: Marcaine 8 cc SPECIMEN:  No Specimen  DISPOSITION OF SPECIMEN:  N/A  COUNTS:  YES  TOURNIQUET:  * No tourniquets in log *  DICTATION: .Dragon Dictation  PLAN OF CARE: Discharge to home after PACU  PATIENT DISPOSITION:  PACU - hemodynamically stable.   Delay start of Pharmacological VTE agent (>24hrs) due to surgical blood loss or risk of bleeding: not applicable Details of procedure: Patient was taken the operating room prepped and draped for combined abdominal and vaginal procedure a timeout conducted and confirmed by operative team.  No antibiotics were administered.  The bladder had been in and out catheterization during prep.  Attention was directed to the umbilicus.  Because of the suspicion of a small hernia just to the right of the umbilical stem we made a slightly asymmetric semicircular incision beginning at approximately 5:00 at the inferior aspects of the umbilicus and curving around till 9:00 around the umbilicus and sharply dissecting down the aspects of the umbilical stem to the fascia.  Palpation in the incision did not identify a hernia rim.  The abdomen was elevated by grasping the skin and fatty tissue elevating it orienting the needle toward the pelvis and placing a Veress needle through the umbilical area with loss-of-resistance technique confirming intraperitoneal location with free flow of fluid into the abdominal cavity.   Pneumoperitoneum was achieved under low pressures less than 10 mm, x3 L of CO2.  The umbilical 5 mm trocar was then inserted without difficulty.  Attention was then directed to the suprapubic area where an incision was made 8 mm laparoscopic trocar inserted under direct visualization and attention directed to the pelvis.  The bowel was normal in appearance with no evidence of any difficulties associated with peritoneal entry and insufflation.  The pelvis was inspected and there were no evidence of trauma or bleeding.  The 5 mm camera was repositioned to the suprapubic site and looked up at the umbilical site.  The laparoscopic umbilical trocar was manipulated trying to identify a hernia sac and there was none to be identified.  Plans for herniorrhaphy were abandoned.  It was felt that we are perhaps feeling some subcutaneous fatty tissue that gave the sensation of fatty omentum around the umbilicus. With the laparoscopic camera in the umbilicus again we directed our attention of the pelvis with the uterus manipulated upward with sponge stick in the vagina and the fallopian tube on the left could be identified to its fimbriated end.  The uterus is quite tiny and the ovaries quiesced and small without cystic lesions.  The fallopian tube on the left could be identified to its fimbriated end, was elevated at its midportion grasped and Falope ring applier and the Falope ring applied without difficulty.  The fallopian tube was infiltrated with Marcaine solution using a percutaneous spinal needle 3 inches in length through the abdominal wall and infiltrating all around the knuckle of tube and within the incarcerated knuckle of tube.  Attention was  then directed to the opposite side where a similar process was performed.  Photos were taken to document the procedure and its successful completion Marcaine was injected on the right side as well.  We then deflated the abdomen instilled 120 cc of saline x2 which assisted with  evacuation of the carbon dioxide by elevating into the abdominal wall.  Deflation of the abdomen followed by removal of laparoscopic trochars, subcuticular 4-0 Vicryl closure of skin incisions and patient going to recovery room in good condition.  Steri-Strips and Band-Aids were applied to the skin incisions condition recovery in stable

## 2018-02-28 NOTE — Anesthesia Procedure Notes (Signed)
Procedure Name: Intubation Date/Time: 02/28/2018 7:40 AM Performed by: Ollen Bowl, CRNA Pre-anesthesia Checklist: Patient identified, Patient being monitored, Timeout performed, Emergency Drugs available and Suction available Patient Re-evaluated:Patient Re-evaluated prior to induction Oxygen Delivery Method: Circle system utilized Preoxygenation: Pre-oxygenation with 100% oxygen Induction Type: IV induction Ventilation: Mask ventilation without difficulty Laryngoscope Size: Mac and 3 Grade View: Grade I Tube type: Oral Tube size: 7.0 mm Number of attempts: 1 Airway Equipment and Method: Stylet Placement Confirmation: ETT inserted through vocal cords under direct vision,  positive ETCO2 and breath sounds checked- equal and bilateral Secured at: 21 cm Tube secured with: Tape Dental Injury: Teeth and Oropharynx as per pre-operative assessment

## 2018-03-03 ENCOUNTER — Encounter (HOSPITAL_COMMUNITY): Payer: Self-pay | Admitting: Obstetrics and Gynecology

## 2018-03-07 ENCOUNTER — Ambulatory Visit (INDEPENDENT_AMBULATORY_CARE_PROVIDER_SITE_OTHER): Payer: 59 | Admitting: Obstetrics and Gynecology

## 2018-03-07 ENCOUNTER — Encounter: Payer: Self-pay | Admitting: *Deleted

## 2018-03-07 ENCOUNTER — Encounter: Payer: Self-pay | Admitting: Obstetrics and Gynecology

## 2018-03-07 VITALS — BP 125/71 | HR 93 | Ht 63.0 in | Wt 191.0 lb

## 2018-03-07 DIAGNOSIS — Z09 Encounter for follow-up examination after completed treatment for conditions other than malignant neoplasm: Secondary | ICD-10-CM

## 2018-03-07 DIAGNOSIS — Z9889 Other specified postprocedural states: Secondary | ICD-10-CM

## 2018-03-07 NOTE — Progress Notes (Signed)
Patient ID: Patricia BueJennifer S Navarro, female   DOB: [birth year], 37 y.o.   MRN: [id number]  Subjective:  Patricia BueJennifer S Berberian is a 37 y.o. female now 1 weeks status post BTL (02/28/2018). She has bruising on her stomach but she reports pain lower in the abdomen than the bruising (RLQ). She also began spotting yesterday afternoon and is coughing a lot. She has not been lifting her son or doing any physical work yet.   Review of Systems Negative except + abdominal pain, + spotting   Diet:   normal   Bowel movements : normal.  Pain is controlled without any medications. (no medications mentioned)  Objective:  There were no vitals taken for this visit. General: Well developed, well nourished.  No acute distress. Abdomen: Bowel sounds normal, soft, non-tender. Pelvic Exam: deferred  Incision(s): healing well   Assessment:  Post-Op 1 weeks s/p BTL   Doing well postoperatively.   Plan:  1. Wound care discussed   2. current medications. 3. Activity restrictions: no bending, stooping, or squatting and no overhead lifting 4. return to work: 1-2 weeks. 5. Follow up prn  By signing my name below, I, Pietro CassisEmily Tufford, attest that this documentation has been prepared under the direction and in the presence of Tilda BurrowFerguson, Nicholi Ghuman V, MD. Electronically Signed: Pietro CassisEmily Tufford, Medical Scribe. 03/07/18. 9:33 AM.  I personally performed the services described in this documentation, which was SCRIBED in my presence. The recorded information has been reviewed and considered accurate. It has been edited as necessary during review. Tilda BurrowJohn V Melida Northington, MD

## 2019-06-02 ENCOUNTER — Other Ambulatory Visit: Payer: Self-pay | Admitting: Pulmonary Disease

## 2019-06-02 ENCOUNTER — Other Ambulatory Visit (HOSPITAL_COMMUNITY): Payer: Self-pay | Admitting: Pulmonary Disease

## 2019-06-02 DIAGNOSIS — R109 Unspecified abdominal pain: Secondary | ICD-10-CM

## 2019-08-23 ENCOUNTER — Other Ambulatory Visit: Payer: Self-pay

## 2019-08-23 ENCOUNTER — Emergency Department (HOSPITAL_COMMUNITY)
Admission: EM | Admit: 2019-08-23 | Discharge: 2019-08-23 | Disposition: A | Discharge status: Home / Self Care | Payer: 59 | Attending: Emergency Medicine | Admitting: Emergency Medicine

## 2019-08-23 ENCOUNTER — Encounter (HOSPITAL_COMMUNITY): Payer: Self-pay | Admitting: *Deleted

## 2019-08-23 DIAGNOSIS — U071 COVID-19: Secondary | ICD-10-CM | POA: Diagnosis not present

## 2019-08-23 DIAGNOSIS — J45909 Unspecified asthma, uncomplicated: Secondary | ICD-10-CM | POA: Diagnosis not present

## 2019-08-23 DIAGNOSIS — R0789 Other chest pain: Secondary | ICD-10-CM | POA: Diagnosis present

## 2019-08-23 DIAGNOSIS — Z79899 Other long term (current) drug therapy: Secondary | ICD-10-CM | POA: Diagnosis not present

## 2019-08-23 MED ORDER — AZITHROMYCIN 250 MG PO TABS: ORAL_TABLET | ORAL | 0 refills | Status: DC

## 2019-08-23 MED ORDER — PREDNISONE 10 MG PO TABS: 20.0000 mg | ORAL_TABLET | Freq: Every day | ORAL | 0 refills | Status: DC

## 2019-08-23 MED ORDER — PREDNISONE 50 MG PO TABS
60.0000 mg | ORAL_TABLET | Freq: Once | ORAL | Status: AC
  Administered 2019-08-23: 60 mg via ORAL
  Filled 2019-08-23: qty 1

## 2019-08-23 MED ORDER — AZITHROMYCIN 250 MG PO TABS
500.0000 mg | ORAL_TABLET | Freq: Once | ORAL | Status: AC
  Administered 2019-08-23: 19:00:00 500 mg via ORAL
  Filled 2019-08-23: qty 2

## 2019-08-23 NOTE — Discharge Instructions (Addendum)
Fluids, Tylenol for fever, prescription for prednisone and Zithromax sent to your pharmacy.

## 2019-08-23 NOTE — ED Notes (Signed)
Pt reports that if she goes outside to the cold air her chest pain is better,

## 2019-08-23 NOTE — ED Triage Notes (Signed)
Pt states that she tested positive for covid yesterday, c/o chest pain, sob, loss of taste and smell,

## 2019-08-23 NOTE — ED Provider Notes (Signed)
[facility] EMERGENCY DEPARTMENT Provider Note   CSN: [id number] Arrival date & time: 08/23/19  1734     History Chief Complaint  Patient presents with  . Chest Pain    Patricia Navarro is a 39 y.o. female.  Cough, wheezing, shortness of breath for the past several days.  Covid test positive.  No history of asthma.  She has been taking her inhalers.  Her primary care doctor typically prescribes her Zithromax and prednisone when her respiratory status decompensates.  She is ambulatory in the home and able to do minimal chores.  Cold air makes her symptoms better.  Severity is mild to moderate.        Past Medical History:  Diagnosis Date  . Asthma   . Hypoglycemia   . Seizures (HCC)    had 1 seizure from hypoglycemia in early 20's. No meds and no more seizures since then.    Patient Active Problem List   Diagnosis Date Noted  . Abnormal Pap smear of cervix 04/11/2016  . Elevated cholesterol 04/11/2016  . History of gestational hypertension 04/04/2016  . Cervical high risk HPV (human papillomavirus) test positive 04/04/2015  . History of shoulder dystocia in prior pregnancy, currently pregnant 03/29/2015    Past Surgical History:  Procedure Laterality Date  . EYE SURGERY     removal of foreign body, right eye  . TONSILLECTOMY    . TUBAL LIGATION Bilateral 02/28/2018   Procedure: LAPAROSCOPIC BILATERAL TUBAL LIGATION;  Surgeon: Tilda Burrow, MD;  Location: [facility];  Service: Gynecology;  Laterality: Bilateral;     OB History    Gravida  4   Para  4   Term  4   Preterm      AB      Living  4     SAB      TAB      Ectopic      Multiple  0   Live Births  4           Family History  Problem Relation Age of Onset  . Thyroid disease Mother   . Heart disease Mother   . Asthma Mother   . ADD / ADHD Son   . Heart attack Maternal Grandmother   . Thyroid disease Maternal Grandmother   . Cancer Maternal Grandfather   . Cancer Paternal  Grandmother        breast  . Other Paternal Grandmother        aneursym  . Emphysema Paternal Grandfather     Social History   Tobacco Use  . Smoking status: Never Smoker  . Smokeless tobacco: Never Used  Substance Use Topics  . Alcohol use: No  . Drug use: No    Home Medications Prior to Admission medications   Medication Sig Start Date End Date Taking? Authorizing Provider  albuterol (PROVENTIL HFA;VENTOLIN HFA) 108 (90 BASE) MCG/ACT inhaler Inhale 2 puffs into the lungs every 6 (six) hours as needed for wheezing or shortness of breath. Patient not taking: Reported on 03/07/2018 07/27/15   Jacklyn Shell, CNM  azithromycin [facility]) 250 MG tablet 1 tablet daily for 4 more days starting Monday evening 08/23/19   Donnetta Hutching, MD  cetirizine (ZYRTEC) 10 MG tablet Take 10 mg by mouth every evening.    [provider]  Pediatric Multivit-Minerals-C (CHILDRENS GUMMIES PO) Take 1 tablet by mouth daily.    [provider]  predniSONE (DELTASONE) 10 MG tablet Take 2 tablets (20 mg total)  by mouth daily. 08/23/19   Donnetta Hutching, MD  traMADol (ULTRAM) 50 MG tablet Take 1 tablet (50 mg total) by mouth every 6 (six) hours as needed for moderate pain or severe pain. Patient not taking: Reported on 03/07/2018 02/28/18   Tilda Burrow, MD    Allergies    Patient has no known allergies.  Review of Systems   Review of Systems  All other systems reviewed and are negative.   Physical Exam Updated Vital Signs BP 121/90   Pulse 70   Temp 98.6 F (37 C) (Oral)   Resp 20   Ht 5\' 4"  (1.626 m)   Wt 81.6 kg   SpO2 99%   BMI 30.90 kg/m   Physical Exam Vitals and nursing note reviewed.  Constitutional:      Appearance: She is well-developed.  HENT:     Head: Normocephalic and atraumatic.  Eyes:     Conjunctiva/sclera: Conjunctivae normal.  Cardiovascular:     Rate and Rhythm: Normal rate and regular rhythm.  Pulmonary:     Effort: Pulmonary effort is  normal.     Breath sounds: Normal breath sounds.     Comments: Minimal bilateral expiratory wheeze Abdominal:     General: Bowel sounds are normal.     Palpations: Abdomen is soft.  Musculoskeletal:        General: Normal range of motion.     Cervical back: Neck supple.  Skin:    General: Skin is warm and dry.  Neurological:     General: No focal deficit present.     Mental Status: She is alert and oriented to person, place, and time.  Psychiatric:        Behavior: Behavior normal.     ED Results / Procedures / Treatments   Labs (all labs ordered are listed, but only abnormal results are displayed) Labs Reviewed - No data to display  EKG None  Radiology No results found.  Procedures Procedures (including critical care time)  Medications Ordered in ED Medications  predniSONE (DELTASONE) tablet 60 mg (60 mg Oral Given 08/23/19 1833)  azithromycin (ZITHROMAX) tablet 500 mg (500 mg Oral Given 08/23/19 1833)    ED Course  I have reviewed the triage vital signs and the nursing notes.  Pertinent labs & imaging results that were available during my care of the patient were reviewed by me and considered in my medical decision making (see chart for details).    MDM Rules/Calculators/A&P                      Patient is hemodynamically stable.  She is oxygenating well.  Since her primary care doctor typically prescribes prednisone and Zithromax when she has a respiratory flare, I will proceed with same.  Known Covid positivity.   Patricia Navarro was evaluated in Emergency Department on 08/23/2019 for the symptoms described in the history of present illness. She was evaluated in the context of the global COVID-19 pandemic, which necessitated consideration that the patient might be at risk for infection with the SARS-CoV-2 virus that causes COVID-19. Institutional protocols and algorithms that pertain to the evaluation of patients at risk for COVID-19 are in a state of rapid  change based on information released by regulatory bodies including the CDC and federal and state organizations. These policies and algorithms were followed during the patient's care in the ED. Final Clinical Impression(s) / ED Diagnoses Final diagnoses:  COVID-19    Rx / DC Orders  ED Discharge Orders         Ordered    azithromycin (ZITHROMAX) 250 MG tablet     08/23/19 1842    predniSONE (DELTASONE) 10 MG tablet  Daily     08/23/19 1842           Nat Christen, MD 08/23/19 [phone]

## 2019-12-22 ENCOUNTER — Ambulatory Visit: Payer: 59 | Admitting: Family Medicine

## 2020-03-26 ENCOUNTER — Emergency Department (HOSPITAL_COMMUNITY)
Admission: EM | Admit: 2020-03-26 | Discharge: 2020-03-26 | Disposition: A | Discharge status: Home / Self Care | Payer: 59 | Attending: Emergency Medicine | Admitting: Emergency Medicine

## 2020-03-26 ENCOUNTER — Emergency Department (HOSPITAL_COMMUNITY): Payer: 59

## 2020-03-26 ENCOUNTER — Other Ambulatory Visit: Payer: Self-pay

## 2020-03-26 ENCOUNTER — Encounter (HOSPITAL_COMMUNITY): Payer: Self-pay | Admitting: *Deleted

## 2020-03-26 DIAGNOSIS — R0789 Other chest pain: Secondary | ICD-10-CM | POA: Diagnosis not present

## 2020-03-26 DIAGNOSIS — J45909 Unspecified asthma, uncomplicated: Secondary | ICD-10-CM | POA: Diagnosis not present

## 2020-03-26 LAB — COMPREHENSIVE METABOLIC PANEL
ALT: 14 U/L (ref 0–44)
AST: 19 U/L (ref 15–41)
Albumin: 4.1 g/dL (ref 3.5–5.0)
Alkaline Phosphatase: 50 U/L (ref 38–126)
Anion gap: 8 (ref 5–15)
BUN: 21 mg/dL — ABNORMAL HIGH (ref 6–20)
CO2: 23 mmol/L (ref 22–32)
Calcium: 8.8 mg/dL — ABNORMAL LOW (ref 8.9–10.3)
Chloride: 106 mmol/L (ref 98–111)
Creatinine, Ser: 0.78 mg/dL (ref 0.44–1.00)
GFR calc Af Amer: >60 mL/min (ref >60)
GFR calc non Af Amer: >60 mL/min (ref >60)
Glucose, Bld: 131 mg/dL — ABNORMAL HIGH (ref 70–99)
Potassium: 3.5 mmol/L (ref 3.5–5.1)
Sodium: 137 mmol/L (ref 135–145)
Total Bilirubin: 0.6 mg/dL (ref 0.3–1.2)
Total Protein: 7.3 g/dL (ref 6.5–8.1)

## 2020-03-26 LAB — CBC WITH DIFFERENTIAL/PLATELET
Abs Immature Granulocytes: 0.03 10*3/uL (ref 0.00–0.07)
Basophils Absolute: 0 10*3/uL (ref 0.0–0.1)
Basophils Relative: 0 %
Eosinophils Absolute: 0.1 10*3/uL (ref 0.0–0.5)
Eosinophils Relative: 1 %
HCT: 40.1 % (ref 36.0–46.0)
Hemoglobin: 13.1 g/dL (ref 12.0–15.0)
Immature Granulocytes: 0 %
Lymphocytes Relative: 23 %
Lymphs Abs: 2.1 10*3/uL (ref 0.7–4.0)
MCH: 30.1 pg (ref 26.0–34.0)
MCHC: 32.7 g/dL (ref 30.0–36.0)
MCV: 92.2 fL (ref 80.0–100.0)
Monocytes Absolute: 0.6 10*3/uL (ref 0.1–1.0)
Monocytes Relative: 7 %
Neutro Abs: 6.2 10*3/uL (ref 1.7–7.7)
Neutrophils Relative %: 69 %
Platelets: 270 10*3/uL (ref 150–400)
RBC: 4.35 MIL/uL (ref 3.87–5.11)
RDW: 12.9 % (ref 11.5–15.5)
WBC: 9.1 10*3/uL (ref 4.0–10.5)
nRBC: 0 % (ref 0.0–0.2)

## 2020-03-26 LAB — TROPONIN I (HIGH SENSITIVITY): Troponin I (High Sensitivity): <2 ng/L (ref <18)

## 2020-03-26 MED ORDER — LORAZEPAM 1 MG PO TABS: 0.5000 mg | ORAL_TABLET | Freq: Three times a day (TID) | ORAL | 0 refills | Status: AC | PRN

## 2020-03-26 MED ORDER — LORAZEPAM 1 MG PO TABS
1.0000 mg | ORAL_TABLET | Freq: Once | ORAL | Status: AC
  Administered 2020-03-26: 1 mg via ORAL
  Filled 2020-03-26: qty 1

## 2020-03-26 NOTE — ED Notes (Signed)
Reports she has had covid vaccines x 2 pyzer

## 2020-03-26 NOTE — ED Provider Notes (Signed)
[facility] EMERGENCY DEPARTMENT Provider Note   CSN: [id number] Arrival date & time: 03/26/20  1108     History Chief Complaint  Patient presents with  . Chest Pain    Patricia Navarro is a 39 y.o. female.  HPI   39 year old female with chest pain.  Intermittent for the past 2 to 3 weeks.  Tightness in her chest.  Radiates to both her arms.  Her hands will feel numb.  Sometimes short of breath.  No diaphoresis.  Today symptoms began when driving.  Does not clearly seem to be associated with exertion nor has she noticed any appreciable exacerbating or relieving factors otherwise.  Past Medical History:  Diagnosis Date  . Asthma   . Hypoglycemia   . Seizures (HCC)    had 1 seizure from hypoglycemia in early 20's. No meds and no more seizures since then.    Patient Active Problem List   Diagnosis Date Noted  . Abnormal Pap smear of cervix 04/11/2016  . Elevated cholesterol 04/11/2016  . History of gestational hypertension 04/04/2016  . Cervical high risk HPV (human papillomavirus) test positive 04/04/2015  . History of shoulder dystocia in prior pregnancy, currently pregnant 03/29/2015    Past Surgical History:  Procedure Laterality Date  . EYE SURGERY     removal of foreign body, right eye  . TONSILLECTOMY    . TUBAL LIGATION Bilateral 02/28/2018   Procedure: LAPAROSCOPIC BILATERAL TUBAL LIGATION;  Surgeon: Tilda Burrow, MD;  Location: [facility];  Service: Gynecology;  Laterality: Bilateral;     OB History    Gravida  4   Para  4   Term  4   Preterm      AB      Living  4     SAB      TAB      Ectopic      Multiple  0   Live Births  4           Family History  Problem Relation Age of Onset  . Thyroid disease Mother   . Heart disease Mother   . Asthma Mother   . ADD / ADHD Son   . Heart attack Maternal Grandmother   . Thyroid disease Maternal Grandmother   . Cancer Maternal Grandfather   . Cancer Paternal Grandmother        breast    . Other Paternal Grandmother        aneursym  . Emphysema Paternal Grandfather     Social History   Tobacco Use  . Smoking status: Never Smoker  . Smokeless tobacco: Never Used  Vaping Use  . Vaping Use: Never assessed  Substance Use Topics  . Alcohol use: No  . Drug use: No    Home Medications Prior to Admission medications   Medication Sig Start Date End Date Taking? Authorizing Provider  albuterol (PROVENTIL HFA;VENTOLIN HFA) 108 (90 BASE) MCG/ACT inhaler Inhale 2 puffs into the lungs every 6 (six) hours as needed for wheezing or shortness of breath. Patient not taking: Reported on 03/07/2018 07/27/15   Jacklyn Shell, CNM  azithromycin [facility]) 250 MG tablet 1 tablet daily for 4 more days starting Monday evening 08/23/19   Donnetta Hutching, MD  cetirizine (ZYRTEC) 10 MG tablet Take 10 mg by mouth every evening.    [provider]  Pediatric Multivit-Minerals-C (CHILDRENS GUMMIES PO) Take 1 tablet by mouth daily.    [provider]  predniSONE (DELTASONE) 10 MG tablet Take  2 tablets (20 mg total) by mouth daily. 08/23/19   Donnetta Hutching, MD  traMADol (ULTRAM) 50 MG tablet Take 1 tablet (50 mg total) by mouth every 6 (six) hours as needed for moderate pain or severe pain. Patient not taking: Reported on 03/07/2018 02/28/18   Tilda Burrow, MD    Allergies    Patient has no known allergies.  Review of Systems   Review of Systems All systems reviewed and negative, other than as noted in HPI.  Physical Exam Updated Vital Signs BP 129/82   Pulse 92   Temp 98.4 F (36.9 C) (Oral)   Resp (!) 21   Ht 5\' 4"  (1.626 m)   Wt 80.7 kg   SpO2 100%   BMI 30.55 kg/m   Physical Exam Vitals and nursing note reviewed.  Constitutional:      General: She is not in acute distress.    Appearance: She is well-developed.  HENT:     Head: Normocephalic and atraumatic.  Eyes:     General:        Right eye: No discharge.        Left eye: No discharge.      Conjunctiva/sclera: Conjunctivae normal.  Cardiovascular:     Rate and Rhythm: Normal rate and regular rhythm.     Heart sounds: Normal heart sounds. No murmur heard.  No friction rub. No gallop.   Pulmonary:     Effort: Pulmonary effort is normal. No respiratory distress.     Breath sounds: Normal breath sounds.  Abdominal:     General: There is no distension.     Palpations: Abdomen is soft.     Tenderness: There is no abdominal tenderness.  Musculoskeletal:        General: No tenderness.     Cervical back: Neck supple.     Comments: Lower extremities symmetric as compared to each other. No calf tenderness. Negative Homan's. No palpable cords.   Skin:    General: Skin is warm and dry.  Neurological:     Mental Status: She is alert.  Psychiatric:        Behavior: Behavior normal.        Thought Content: Thought content normal.     ED Results / Procedures / Treatments   Labs (all labs ordered are listed, but only abnormal results are displayed) Labs Reviewed  COMPREHENSIVE METABOLIC PANEL - Abnormal; Notable for the following components:      Result Value   Glucose, Bld 131 (*)    BUN 21 (*)    Calcium 8.8 (*)    All other components within normal limits  CBC WITH DIFFERENTIAL/PLATELET  TROPONIN I (HIGH SENSITIVITY)    EKG EKG Interpretation  Date/Time:  Saturday March 26 2020 12:07:53 EDT Ventricular Rate:  98 PR Interval:  130 QRS Duration: 72 QT Interval:  326 QTC Calculation: 416 R Axis:   68 Text Interpretation: Normal sinus rhythm Normal ECG Confirmed by 05-07-1998 [phone]) on 03/26/2020 1:27:26 PM   Radiology DG Chest 2 View  Result Date: 03/26/2020 CLINICAL DATA:  Chest pain. EXAM: CHEST - 2 VIEW COMPARISON:  March 24, 2020 FINDINGS: The heart size and mediastinal contours are within normal limits. Both lungs are clear. The visualized skeletal structures are unremarkable. IMPRESSION: No active cardiopulmonary disease. Electronically Signed   By:  March 26, 2020 III M.D   On: 03/26/2020 12:44    Procedures Procedures (including critical care time)  Medications Ordered in ED Medications - No  data to display  ED Course  I have reviewed the triage vital signs and the nursing notes.  Pertinent labs & imaging results that were available during my care of the patient were reviewed by me and considered in my medical decision making (see chart for details).    MDM Rules/Calculators/A&P                          39 year old female with chest tightness.  Seems atypical for ACS.  May potentially be some component of anxiety.  Work-up reassuring.  Doubt PE, dissection of the emergent process.  Reassurance provided.  I think the next reasonable step is provocative testing.  I think this can appropriately be pursued as an outpatient at this time.  Discussed with patient.  Emergent return precautions discussed otherwise.  Final Clinical Impression(s) / ED Diagnoses Final diagnoses:  Chest tightness    Rx / DC Orders ED Discharge Orders    None       Raeford Razor, MD 03/30/20 [phone]

## 2020-03-26 NOTE — ED Triage Notes (Signed)
Pt c/o mid center chest pain that radiates to bilateral arms that has been intermittent for the past few weeks, pain is associated with nausea, sob,

## 2020-03-26 NOTE — ED Notes (Signed)
Tearful and anxious   Reports mid sternal CP for several months   Worse after she heats   Has heart eval scheduled for next week   Here for eval of midsternal CP

## 2020-04-22 ENCOUNTER — Encounter: Payer: Self-pay | Admitting: Family Medicine

## 2020-06-20 ENCOUNTER — Ambulatory Visit: Payer: 59 | Admitting: Cardiology

## 2021-09-06 ENCOUNTER — Emergency Department (HOSPITAL_COMMUNITY)
Admission: EM | Admit: 2021-09-06 | Discharge: 2021-09-06 | Disposition: A | Discharge status: Home / Self Care | Payer: BLUE CROSS/BLUE SHIELD | Attending: Emergency Medicine | Admitting: Emergency Medicine

## 2021-09-06 ENCOUNTER — Emergency Department (HOSPITAL_COMMUNITY): Payer: BLUE CROSS/BLUE SHIELD

## 2021-09-06 DIAGNOSIS — R079 Chest pain, unspecified: Secondary | ICD-10-CM

## 2021-09-06 DIAGNOSIS — J45909 Unspecified asthma, uncomplicated: Secondary | ICD-10-CM | POA: Insufficient documentation

## 2021-09-06 DIAGNOSIS — R002 Palpitations: Secondary | ICD-10-CM | POA: Diagnosis present

## 2021-09-06 DIAGNOSIS — Z79899 Other long term (current) drug therapy: Secondary | ICD-10-CM | POA: Diagnosis not present

## 2021-09-06 DIAGNOSIS — R0789 Other chest pain: Secondary | ICD-10-CM | POA: Diagnosis not present

## 2021-09-06 LAB — BASIC METABOLIC PANEL
Anion gap: 10 (ref 5–15)
BUN: 15 mg/dL (ref 6–20)
CO2: 23 mmol/L (ref 22–32)
Calcium: 9.4 mg/dL (ref 8.9–10.3)
Chloride: 105 mmol/L (ref 98–111)
Creatinine, Ser: 0.75 mg/dL (ref 0.44–1.00)
GFR, Estimated: >60 mL/min (ref >60)
Glucose, Bld: 100 mg/dL — ABNORMAL HIGH (ref 70–99)
Potassium: 4.1 mmol/L (ref 3.5–5.1)
Sodium: 138 mmol/L (ref 135–145)

## 2021-09-06 LAB — CBC
HCT: 41.3 % (ref 36.0–46.0)
Hemoglobin: 13.9 g/dL (ref 12.0–15.0)
MCH: 29.8 pg (ref 26.0–34.0)
MCHC: 33.7 g/dL (ref 30.0–36.0)
MCV: 88.4 fL (ref 80.0–100.0)
Platelets: 294 10*3/uL (ref 150–400)
RBC: 4.67 MIL/uL (ref 3.87–5.11)
RDW: 12.7 % (ref 11.5–15.5)
WBC: 8 10*3/uL (ref 4.0–10.5)
nRBC: 0 % (ref 0.0–0.2)

## 2021-09-06 LAB — TROPONIN I (HIGH SENSITIVITY): Troponin I (High Sensitivity): <2 ng/L (ref <18)

## 2021-09-06 NOTE — ED Provider Triage Note (Signed)
Emergency Medicine Provider Triage Evaluation Note  Patricia Navarro , a 41 y.o. female  was evaluated in triage.  Pt complains of chest tightness since last night.  Patient states that she was in her usual state of health, when suddenly she began to feel her heart rate at which time she checked her pulse and it read 111.  Patient states that she then took one of her prescribed Ativan medications that she takes for her panic attacks, and then went to bed.  Patient states this morning walking to work, her heart rate spiked again up to 159.  Patient states that she then became nauseous, and short of breath.  She stated that she did not took her albuterol inhaler at this time.  Patient reports that this did not relieve her chest tightness.  Review of Systems  Positive: Chest tightness, shortness of breath Negative: Vomiting, diarrhea, fevers, leg swelling  Physical Exam  There were no vitals taken for this visit. Gen:   Awake, no distress   Resp:  Normal effort  MSK:   Moves extremities without difficulty  Other:  Lungs clear  Medical Decision Making  Medically screening exam initiated at 11:36 AM.  Appropriate orders placed.  ESTEE YOHE was informed that the remainder of the evaluation will be completed by another provider, this initial triage assessment does not replace that evaluation, and the importance of remaining in the ED until their evaluation is complete.     Al Decant, PA-C 09/06/21 1137

## 2021-09-06 NOTE — ED Provider Notes (Signed)
[facility] EMERGENCY [facility]PART[facility] Provider Note   CSN: [id number] Arrival date & time: 09/06/21  [id number]     History  Chief Complaint  Patient presents with   Palpitations    Patricia Navarro is a 41 y.o. female.  Patient here for palpitations.  States that her heart rate might of been in the 150s.  Had a headache.  Denies any chest pain or shortness of breath or headaches now.  No history of heart issues or heart arrhythmias.  History of asthma.     Palpitations Palpitations quality:  Fast Onset quality:  Sudden Progression:  Resolved Chronicity:  New Relieved by:  Nothing Worsened by:  Nothing Associated symptoms: no back pain, no chest pain, no chest pressure, no cough, no diaphoresis, no dizziness, no hemoptysis, no leg pain, no lower extremity edema, no malaise/fatigue, no nausea, no near-syncope, no numbness, no orthopnea, no shortness of breath, no syncope, no vomiting and no weakness       Home Medications Prior to Admission medications   Medication Sig Start Date End Date Taking? Authorizing Provider  albuterol (PROVENTIL HFA;VENTOLIN HFA) 108 (90 BASE) MCG/ACT inhaler Inhale 2 puffs into the lungs every 6 (six) hours as needed for wheezing or shortness of breath. Patient not taking: Reported on 03/07/2018 07/27/15   Jacklyn Shellresenzo-Dishmon, Frances, CNM  azithromycin [facility](ZITHROMAX) 250 MG tablet 1 tablet daily for 4 more days starting Monday evening 08/23/19   Donnetta Hutchingook, Brian, MD  cetirizine (ZYRTEC) 10 MG tablet Take 10 mg by mouth every evening.    [provider]  LORazepam (ATIVAN) 1 MG tablet Take 0.5 tablets (0.5 mg total) by mouth every 8 (eight) hours as needed for anxiety. 03/26/20   Raeford RazorKohut, Stephen, MD  Pediatric Multivit-Minerals-C (CHILDRENS GUMMIES PO) Take 1 tablet by mouth daily.    [provider]  predniSONE (DELTASONE) 10 MG tablet Take 2 tablets (20 mg total) by mouth daily. 08/23/19   Donnetta Hutchingook, Brian, MD  traMADol (ULTRAM) 50 MG tablet  Take 1 tablet (50 mg total) by mouth every 6 (six) hours as needed for moderate pain or severe pain. Patient not taking: Reported on 03/07/2018 02/28/18   Tilda BurrowFerguson, John V, MD      Allergies    Patient has no known allergies.    Review of Systems   Review of Systems  Constitutional:  Negative for diaphoresis and malaise/fatigue.  Respiratory:  Negative for cough, hemoptysis and shortness of breath.   Cardiovascular:  Positive for palpitations. Negative for chest pain, orthopnea, syncope and near-syncope.  Gastrointestinal:  Negative for nausea and vomiting.  Musculoskeletal:  Negative for back pain.  Neurological:  Negative for dizziness, weakness and numbness.   Physical Exam Updated Vital Signs BP (!) 141/81    Pulse 95    Temp 97.9 F (36.6 C) (Oral)    Resp 15    SpO2 100%  Physical Exam Vitals and nursing note reviewed.  Constitutional:      General: She is not in acute distress.    Appearance: She is well-developed. She is not ill-appearing.  HENT:     Head: Normocephalic and atraumatic.     Nose: Nose normal.     Mouth/Throat:     Mouth: Mucous membranes are moist.  Eyes:     Extraocular Movements: Extraocular movements intact.     Conjunctiva/sclera: Conjunctivae normal.     Pupils: Pupils are equal, round, and reactive to light.  Cardiovascular:     Rate and Rhythm: Normal rate and  regular rhythm.     Pulses: Normal pulses.     Heart sounds: Normal heart sounds. No murmur heard. Pulmonary:     Effort: Pulmonary effort is normal. No respiratory distress.     Breath sounds: Normal breath sounds.  Abdominal:     Palpations: Abdomen is soft.     Tenderness: There is no abdominal tenderness.  Musculoskeletal:        General: No swelling.     Cervical back: Normal range of motion and neck supple.  Skin:    General: Skin is warm and dry.     Capillary Refill: Capillary refill takes less than 2 seconds.  Neurological:     General: No focal deficit present.     Mental  Status: She is alert and oriented to person, place, and time.     Cranial Nerves: No cranial nerve deficit.     Sensory: No sensory deficit.     Motor: No weakness.     Coordination: Coordination normal.     Comments: 5+ out of 5 strength throughout, normal sensation, no drift, normal finger-nose-finger  Psychiatric:        Mood and Affect: Mood normal.        Behavior: Behavior normal.        Thought Content: Thought content normal.        Judgment: Judgment normal.    ED Results / Procedures / Treatments   Labs (all labs ordered are listed, but only abnormal results are displayed) Labs Reviewed  BASIC METABOLIC PANEL - Abnormal; Notable for the following components:      Result Value   Glucose, Bld 100 (*)    All other components within normal limits  CBC  TROPONIN I (HIGH SENSITIVITY)    EKG EKG Interpretation  Date/Time:  Wednesday September 06 2021 11:47:08 EST Ventricular Rate:  95 PR Interval:  144 QRS Duration: 72 QT Interval:  348 QTC Calculation: 437 R Axis:   63 Text Interpretation: Normal sinus rhythm Normal ECG When compared with ECG of 26-Mar-2020 12:07, PREVIOUS ECG IS PRESENT Confirmed by Virgina Norfolk ([id number]) on 09/06/2021 12:16:26 PM  Radiology DG Chest 2 View  Result Date: 09/06/2021 CLINICAL DATA:  Chest tightness and shortness of breath EXAM: CHEST - 2 VIEW COMPARISON:  None. FINDINGS: The heart size and mediastinal contours are within normal limits. Both lungs are clear. The visualized skeletal structures are unremarkable. IMPRESSION: No evidence of acute cardiopulmonary disease. Electronically Signed   By: Caprice Renshaw M.D.   On: 09/06/2021 12:14    Procedures Procedures    Medications Ordered in ED Medications - No data to display  ED Course/ Medical Decision Making/ A&P                           Medical Decision Making  Patricia Navarro is here with palpitations.  History of asthma.  Normal vitals.  No fever.  EKG per my interpretation shows  sinus rhythm.  No ischemic changes.  No arrhythmia.  She had episodes of palpitations today.  Her heart rate on her watch said may be 159.  She states that she had panic attack type symptoms last year.  This did not quite feel the same.  She denies any chest pain.  She had a brief headache.  Overall she is neurologically intact.  Asymptomatic now.  She is tearful on exam.  Overall exam is unremarkable.  Differential diagnosis includes electrolyte abnormality, PACs versus  PVCs.  Less likely acute coronary syndrome, dangerous arrhythmia.  Possibly this could have been a panic/anxiety type event.  She is PERC negative and doubt pulmonary embolism.  To evaluate CBC, BMP, troponin, chest x-ray has been ordered.  My review and interpretation of chest x-ray shows no evidence of pneumonia or pneumothorax.  My review and interpretation the labs show no significant anemia, electrolyte abnormality, kidney injury.  No significant leukocytosis, anemia or electrolyte abnormality.  Troponin is normal.  No cardiac risk factors.  No chest pain.  Doubt acute coronary syndrome.  Overall suspect symptomatic PACs or PVCs versus anxiety.  We will have her follow-up with primary care doctor for event monitor.  Discharged in good condition.  This chart was dictated using voice recognition software.  Despite best efforts to proofread,  errors can occur which can change the documentation meaning.         Final Clinical Impression(s) / ED Diagnoses Final diagnoses:  Palpitations    Rx / DC Orders ED Discharge Orders     None         Virgina Norfolk, DO 09/06/21 1250

## 2021-09-06 NOTE — ED Triage Notes (Signed)
Pt. Stated, I started having increased heart rate and SOB. Went to bed and sent to work this morning and it started beating fast and I looked at my watch and it read 159 , and I had a terrible headache.

## 2021-09-06 NOTE — ED Notes (Signed)
Discharge instructions reviewed and explained, pt verbalized understanding.  ?

## 2021-09-06 NOTE — Discharge Instructions (Addendum)
Work-up today is normal.  No evidence of heart attack, infection or other significant findings on your lab work.  Recommend that you follow-up with your primary care doctor for event monitor.  My suspicion is that you are having benign palpitations.  However wearing an event monitor can help further evaluate for abnormal heart rhythms.

## 2021-10-25 ENCOUNTER — Ambulatory Visit (INDEPENDENT_AMBULATORY_CARE_PROVIDER_SITE_OTHER): Payer: BLUE CROSS/BLUE SHIELD | Admitting: Obstetrics & Gynecology

## 2021-10-25 ENCOUNTER — Encounter: Payer: Self-pay | Admitting: Obstetrics & Gynecology

## 2021-10-25 ENCOUNTER — Other Ambulatory Visit (HOSPITAL_COMMUNITY)
Admission: RE | Admit: 2021-10-25 | Discharge: 2021-10-25 | Disposition: A | Discharge status: Home / Self Care | Payer: BLUE CROSS/BLUE SHIELD | Source: Ambulatory Visit | Attending: Obstetrics & Gynecology | Admitting: Obstetrics & Gynecology

## 2021-10-25 ENCOUNTER — Other Ambulatory Visit: Payer: Self-pay

## 2021-10-25 VITALS — BP 134/84 | HR 93 | Ht 64.0 in | Wt 178.4 lb

## 2021-10-25 DIAGNOSIS — Z01419 Encounter for gynecological examination (general) (routine) without abnormal findings: Secondary | ICD-10-CM | POA: Insufficient documentation

## 2021-10-25 NOTE — Patient Instructions (Signed)
Please schedule a mammogram at one of the following locations:  [place]: [phone]  [facility] [place]:[phone] [address]  

## 2021-10-25 NOTE — Progress Notes (Signed)
? ?WELL-WOMAN EXAMINATION ?Patient name: Patricia Navarro MRN [id number]  Date of birth: [birth year] ?Chief Complaint:   ?Gynecologic Exam and Annual Exam ? ?History of Present Illness:   ?Patricia Navarro is a 41 y.o. [phone]  female being seen today for a routine well-woman exam.  ? ?Menses are irregular-  ?Dec 19-28 one week off, then Jan 1-6 ?Feb. 1-4, then week off, Feb 11-16 ?Mar 9-15 ?Menses are moderate, denies HMB.  Prior to tubal ligation did not have any issue with her period. ? ?Today she denies discharge, itching or odor.  Denies pelvic or abdominal pain.  No other acute complaints ? ? ?Patient's last menstrual period was 10/12/2021. ? ?The current method of family planning is tubal ligation.  ? ?Last pap 2019.  ?Last mammogram: n/a. ?Last colonoscopy: n/a ? ? ?  10/25/2021  ?  2:51 PM 12/19/2017  ?  3:56 PM 05/29/2017  ?  8:52 AM 10/15/2016  ?  9:14 AM  ?Depression screen PHQ 2/9  ?Decreased Interest 2 0 0 0  ?Down, Depressed, Hopeless 2 0 0 0  ?PHQ - 2 Score 4 0 0 0  ?Altered sleeping 2 0    ?Tired, decreased energy 2 0    ?Change in appetite 0 0    ?Feeling bad or failure about yourself  0 0    ?Trouble concentrating 3 0    ?Moving slowly or fidgety/restless 2 0    ?Suicidal thoughts 0 0    ?PHQ-9 Score 13 0    ? ? ? ? ?Review of Systems:   ?Pertinent items are noted in HPI ?Denies any headaches, blurred vision, fatigue, shortness of breath, chest pain, abdominal pain, bowel movements, urination, or intercourse unless otherwise stated above. ? ?Pertinent History Reviewed:  ?Reviewed past medical,surgical, social and family history.  ?Reviewed problem list, medications and allergies. ?Physical Assessment:  ? ?Vitals:  ? 10/25/21 1444  ?BP: 134/84  ?Pulse: 93  ?Weight: 178 lb 6.4 oz (80.9 kg)  ?Height: 5\' 4"  (1.626 m)  ?Body mass index is 30.62 kg/m?. ?  ?     Physical Examination:  ? General appearance - well appearing, and in no distress ? Mental status - alert, oriented to person, place, and  time ? Psych:  She has a normal mood and affect ? Skin - warm and dry, normal color, no suspicious lesions noted ? Chest - effort normal, all lung fields clear to auscultation bilaterally ? Heart - normal rate and regular rhythm ? Neck:  midline trachea, no thyromegaly or nodules ? Breasts - breasts appear normal, no suspicious masses, no skin or nipple changes or  axillary nodes ? Abdomen - soft, nontender, nondistended, no masses or organomegaly ? Pelvic - VULVA: normal appearing vulva with no masses, tenderness or lesions  VAGINA: normal appearing vagina with normal color and discharge, no lesions  CERVIX: normal appearing cervix without discharge or lesions, no CMT ? Thin prep pap is done with HR HPV cotesting ? UTERUS: uterus is felt to be normal size, shape, consistency and nontender  ? ADNEXA: No adnexal masses or tenderness noted. ? Extremities:  No swelling or varicosities noted ? ?Chaperone:   ? ? ?Assessment & Plan:  ?1) Well-Woman Exam ?-pap collected, reviewed screening guidelines ?-mammogram ordered ? ?2) AUB ?-discussed management options ?-due to her h/o AGS (alpha-GAL syndrome) she is anxious to start new medication ?-for now plan to monitor periods and will f/u prn ? ?No orders of the defined types  were placed in this encounter. ? ? ?Meds: No orders of the defined types were placed in this encounter. ? ? ?Follow-up: Return in about 1 year (around 10/26/2022) for Annual, please print AVS. ? ? ?Myna Hidalgo, DO ?Attending Obstetrician & Gynecologist, Faculty Practice ?Center for [employer], [facility] ? ? ?

## 2021-10-31 LAB — CYTOLOGY - PAP
Chlamydia: NEGATIVE
Comment: NEGATIVE
Comment: NEGATIVE
Comment: NORMAL
Diagnosis: NEGATIVE
High risk HPV: NEGATIVE
Neisseria Gonorrhea: NEGATIVE

## 2021-11-01 ENCOUNTER — Encounter: Payer: Self-pay | Admitting: Adult Health

## 2021-11-01 ENCOUNTER — Ambulatory Visit (INDEPENDENT_AMBULATORY_CARE_PROVIDER_SITE_OTHER): Payer: BLUE CROSS/BLUE SHIELD | Admitting: Adult Health

## 2021-11-01 ENCOUNTER — Other Ambulatory Visit: Payer: Self-pay

## 2021-11-01 ENCOUNTER — Other Ambulatory Visit: Payer: Self-pay | Admitting: Obstetrics & Gynecology

## 2021-11-01 ENCOUNTER — Ambulatory Visit (HOSPITAL_COMMUNITY)
Admission: RE | Admit: 2021-11-01 | Discharge: 2021-11-01 | Disposition: A | Discharge status: Home / Self Care | Payer: BLUE CROSS/BLUE SHIELD | Source: Ambulatory Visit | Attending: Obstetrics & Gynecology | Admitting: Obstetrics & Gynecology

## 2021-11-01 VITALS — BP 123/78 | HR 88 | Ht 64.0 in | Wt 179.5 lb

## 2021-11-01 DIAGNOSIS — Z1231 Encounter for screening mammogram for malignant neoplasm of breast: Secondary | ICD-10-CM | POA: Insufficient documentation

## 2021-11-01 DIAGNOSIS — R300 Dysuria: Secondary | ICD-10-CM

## 2021-11-01 DIAGNOSIS — R238 Other skin changes: Secondary | ICD-10-CM | POA: Diagnosis not present

## 2021-11-01 DIAGNOSIS — R319 Hematuria, unspecified: Secondary | ICD-10-CM | POA: Diagnosis not present

## 2021-11-01 DIAGNOSIS — Z01419 Encounter for gynecological examination (general) (routine) without abnormal findings: Secondary | ICD-10-CM | POA: Diagnosis present

## 2021-11-01 LAB — POCT URINALYSIS DIPSTICK
Glucose, UA: NEGATIVE
Ketones, UA: NEGATIVE
Leukocytes, UA: NEGATIVE
Nitrite, UA: NEGATIVE
Protein, UA: NEGATIVE

## 2021-11-01 MED ORDER — VALACYCLOVIR HCL 1 G PO TABS
1000.0000 mg | ORAL_TABLET | Freq: Two times a day (BID) | ORAL | 1 refills | Status: DC
Start: 2021-11-01 — End: 2021-11-10

## 2021-11-01 NOTE — Progress Notes (Signed)
?  Subjective:  ?  ? Patient ID: Patricia Navarro, female   DOB: Feb 16, 1981, 41 y.o.   MRN: [id number] ? ?HPI ?Patricia Navarro is a 41 year old white female, married, G4P4, in complaining of burning with urination, started last week after pap.  Was seen in ER treated for thrush and has been on steroids, and diflucan and was started on metoprolol 25 mg bid. She has Alpha Gal.  ?Lab Results  ?Component Value Date  ? DIAGPAP  10/25/2021  ?  - Negative for intraepithelial lesion or malignancy (NILM)  ? HPV NOT DETECTED 12/19/2017  ? HPVHIGH Negative 10/25/2021  ? PCP is Dr Patricia Navarro. ? ?Review of Systems ?Burning with urination ?Reviewed past medical,surgical, social and family history. Reviewed medications and allergies.  ?   ?Objective:  ? Physical Exam ?BP 123/78 (BP Location: Left Arm, Patient Position: Sitting, Cuff Size: Normal)   Pulse 88   Ht 5\' 4"  (1.626 m)   Wt 179 lb 8 oz (81.4 kg)   LMP 10/12/2021   BMI 30.81 kg/m?  urine dipstick trace blood, ?Skin warm and dry.NO CVAT.Pelvic: external genitalia is normal in appearance, has ruptured vesicle right inner labia near clitoris, vagina: pink and moist,urethra has no lesions or masses noted, cervix:smooth and bulbous, uterus: normal size, shape and contour, non tender, no masses felt, adnexa: no masses or tenderness noted. Bladder is non tender and no masses felt. HSV culture obtained. ?Fall risk is low ? Upstream - 11/01/21 0911   ? ?  ? Pregnancy Intention Screening  ? Does the patient want to become pregnant in the next year? No   ? Does the patient's partner want to become pregnant in the next year? No   ? Would the patient like to discuss contraceptive options today? No   ?  ? Contraception Wrap Up  ? Current Method Female Sterilization   ? End Method Female Sterilization   ? ?  ?  ? ?  ? Co exam with 11/03/21 NP student.  ?   ?Assessment:  ?   ?1. Burning with urination ?Urine dipstick +blood will get UA C&S, and await results  ?- POCT Urinalysis  Dipstick ?- Urine Culture ?- Urinalysis, Routine w reflex microscopic ? ?2. Vesicle of skin ?HSV culture sent ?Will treat now with valtrex, for suspected herpes, discussed with her, and will talk when results back  ?She has had cold sores before  ?Meds ordered this encounter  ?Medications  ? valACYclovir (VALTREX) 1000 MG tablet  ?  Sig: Take 1 tablet (1,000 mg total) by mouth 2 (two) times daily.  ?  Dispense:  20 tablet  ?  Refill:  1  ?  Order Specific Question:   Supervising Provider  ?  Answer:   Lorraine Lax H [[id number]]  ?  ?- Herpes simplex virus culture ? ?3. Hematuria, unspecified type ? ?   ?Plan:  ?   ?Follow up prn  ?   ?

## 2021-11-02 LAB — URINALYSIS, ROUTINE W REFLEX MICROSCOPIC
Bilirubin, UA: NEGATIVE
Glucose, UA: NEGATIVE
Ketones, UA: NEGATIVE
Leukocytes,UA: NEGATIVE
Nitrite, UA: NEGATIVE
Protein,UA: NEGATIVE
RBC, UA: NEGATIVE
Specific Gravity, UA: 1.029 (ref 1.005–1.030)
Urobilinogen, Ur: 0.2 mg/dL (ref 0.2–1.0)
pH, UA: 5.5 (ref 5.0–7.5)

## 2021-11-03 LAB — URINE CULTURE

## 2021-11-04 LAB — HERPES SIMPLEX VIRUS CULTURE

## 2021-11-10 ENCOUNTER — Ambulatory Visit: Payer: BLUE CROSS/BLUE SHIELD | Admitting: Internal Medicine

## 2021-11-10 ENCOUNTER — Encounter: Payer: Self-pay | Admitting: Internal Medicine

## 2021-11-10 VITALS — BP 126/70 | HR 82 | Ht 64.0 in | Wt 178.4 lb

## 2021-11-10 DIAGNOSIS — R002 Palpitations: Secondary | ICD-10-CM

## 2021-11-10 NOTE — Patient Instructions (Signed)
Medication Instructions:  Your physician recommends that you continue on your current medications as directed. Please refer to the Current Medication list given to you today.  *If you need a refill on your cardiac medications before your next appointment, please call your pharmacy*   Lab Work: NONE   If you have labs (blood work) drawn today and your tests are completely normal, you will receive your results only by: MyChart Message (if you have MyChart) OR A paper copy in the mail If you have any lab test that is abnormal or we need to change your treatment, we will call you to review the results.   Testing/Procedures: NONE    Follow-Up: At [facility], you and your health needs are our priority.  As part of our continuing mission to provide you with exceptional heart care, we have created designated Provider Care Teams.  These Care Teams include your primary Cardiologist (physician) and Advanced Practice Providers (APPs -  Physician Assistants and Nurse Practitioners) who all work together to provide you with the care you need, when you need it.  We recommend signing up for the patient portal called "MyChart".  Sign up information is provided on this After Visit Summary.  MyChart is used to connect with patients for Virtual Visits (Telemedicine).  Patients are able to view lab/test results, encounter notes, upcoming appointments, etc.  Non-urgent messages can be sent to your provider as well.   To learn more about what you can do with MyChart, go to [email or web address].    Your next appointment:    As Needed   The format for your next appointment:   In Person  Provider:   Paula Ross, MD   Other Instructions Thank you for choosing [facility]!    

## 2021-11-10 NOTE — Progress Notes (Signed)
? ?Cardiology Office Note ? ? ?Date:  11/10/2021  ? ?ID:  Patricia Navarro, DOB 19-Mar-1981, MRN [id number] ? ?PCP:  Juliette Alcide, MD  ?Cardiologist:   Dietrich Pates, MD  ? ? ?Pt referred for evaluation of tachycardia   ?  ?History of Present Illness: ?Patricia Navarro is a 41 y.o. female with a history of palpitations   She was seen in ED on 09/06/21   ? ?Patient was laying down   Heart felt funny    One big beat then she says her heart went  real fast    ?Went to bed    WOke up next morning   Went to work   Felt dizzy    ?First time ever had this    ?Got to ED at 11 AM    With laying it would calm down   Standing her HR would race ?The pt says when she goes to see her PCP the doctor alsway says her heart rate is  fast   ?With walking her HR will be 130s to 150 ?Denies syncope   ?Claims she drinks a lot of water  ?Does have alpha gal.  Says no chance of meat exposure     ? ? ? ?Current Meds  ?Medication Sig  ? albuterol (PROVENTIL HFA;VENTOLIN HFA) 108 (90 BASE) MCG/ACT inhaler Inhale 2 puffs into the lungs every 6 (six) hours as needed for wheezing or shortness of breath.  ? cefPROZIL (CEFZIL) 250 MG tablet Take 250 mg by mouth 2 (two) times daily.  ? Cyanocobalamin (VITAMIN B-12 PO) Take by mouth. Gummy-one daily  ? EPINEPHrine 0.3 mg/0.3 mL IJ SOAJ injection Inject into the muscle.  ? hydrOXYzine (VISTARIL) 25 MG capsule Take 25-50 mg by mouth at bedtime as needed for itching.  ? LORazepam (ATIVAN) 1 MG tablet Take 0.5 tablets (0.5 mg total) by mouth every 8 (eight) hours as needed for anxiety.  ? metoprolol tartrate (LOPRESSOR) 25 MG tablet Take 12.5 mg by mouth 2 (two) times daily.  ? ? ? ?Allergies:   Other and Alpha-gal  ? ?Past Medical History:  ?Diagnosis Date  ? Asthma   ? Hypertension   ? Hypoglycemia   ? Seizures (HCC)   ? had 1 seizure from hypoglycemia in early 20's. No meds and no more seizures since then.  ? ? ?Past Surgical History:  ?Procedure Laterality Date  ? EYE SURGERY    ? removal of  foreign body, right eye  ? TONSILLECTOMY    ? TUBAL LIGATION Bilateral 02/28/2018  ? Procedure: LAPAROSCOPIC BILATERAL TUBAL LIGATION;  Surgeon: Tilda Burrow, MD;  Location: [facility];  Service: Gynecology;  Laterality: Bilateral;  ? ? ? ?Social History:  The patient  reports that she has never smoked. She has never used smokeless tobacco. She reports that she does not drink alcohol and does not use drugs.  ? ?Family History:  The patient's family history includes ADD / ADHD in her son; Asthma in her mother; Cancer in her maternal grandfather and paternal grandmother; Emphysema in her paternal grandfather; Heart attack in her maternal grandmother; Heart disease in her mother; Other in her paternal grandmother; Thyroid disease in her maternal grandmother and mother.  ? ? ?ROS:  Please see the history of present illness. All other systems are reviewed and  Negative to the above problem except as noted.  ? ? ?PHYSICAL EXAM: ?VS:  BP 126/70   Pulse 82   Ht 5\' 4"  (1.626 m)  Wt 178 lb 6.4 oz (80.9 kg)   LMP 10/12/2021   SpO2 97%   BMI 30.62 kg/m?   ? ?BP laying   104/70   P 67   Sitting   106/71  P 70   [place]   109/73  P 72   Standing 4 min   114/74   P 82 ? ?GEN: Obese 41 yo  in no acute distress  ?HEENT: normal  ?Neck: no JVD, carotid bruits, o ?Cardiac: RRR; no murmurs.  No LE edema  ?Respiratory:  clear to auscultation bilaterally,  ?GI: soft, nontender, nondistended, + BS  No hepatomegaly  ?MS: no deformity Moving all extremities   ?Skin: warm and dry, no rash ?Neuro:  Strength and sensation are intact ?Psych: euthymic mood, full affect ? ? ?EKG:  EKG is not ordered today.  On 09/06/21  NSR   ? ? ?Lipid Panel ?   ?Component Value Date/Time  ? CHOL 205 (H) 04/06/2016 1332  ? TRIG 70 04/06/2016 1332  ? HDL 52 04/06/2016 1332  ? CHOLHDL 3.9 04/06/2016 1332  ? LDLCALC 139 (H) 04/06/2016 1332  ? ?  ? ?Wt Readings from Last 3 Encounters:  ?11/10/21 178 lb 6.4 oz (80.9 kg)  ?11/01/21 179 lb 8 oz (81.4 kg)   ?10/25/21 178 lb 6.4 oz (80.9 kg)  ?  ? ? ?ASSESSMENT AND PLAN: ? ?1  Tachycardia   Pt has spells of tachycardia that do not sound like an arrhythmia    Sound more like sinus tach   I wonder if she stays adequately hydrated or if she has a mild dysautonomia    She is not orthostatic on exam   Recent UA SG was 1.03 which is concentrated   Hgb normal     ? ?For now I would follow    INcrease hydration   Increase salt intake some   Stay active   Keep LE muscles toned ? ?Spell does not appear to be rlelated to alpha gal, though this would lead to signficiant BP changes due to histamine release ? ?Encouraged her to give it time    It is not severe  ? ?Will be available as needed ? ? ? ?Current medicines are reviewed at length with the patient today.  The patient does not have concerns regarding medicines. ? ?Signed, ?Dietrich Pates, MD  ?11/10/2021 2:06 PM    ?[facility] ?[address] [place], [place], [place]  [address] ?Phone: [phone]; Fax: [phone]  ? ? ?

## 2021-11-20 ENCOUNTER — Ambulatory Visit: Payer: BLUE CROSS/BLUE SHIELD | Admitting: Allergy & Immunology

## 2021-11-20 ENCOUNTER — Encounter: Payer: Self-pay | Admitting: Allergy & Immunology

## 2021-11-20 VITALS — BP 104/82 | HR 91 | Temp 97.9°F | Resp 16 | Ht 64.0 in | Wt 177.8 lb

## 2021-11-20 DIAGNOSIS — L508 Other urticaria: Secondary | ICD-10-CM | POA: Diagnosis not present

## 2021-11-20 DIAGNOSIS — J302 Other seasonal allergic rhinitis: Secondary | ICD-10-CM

## 2021-11-20 DIAGNOSIS — J4520 Mild intermittent asthma, uncomplicated: Secondary | ICD-10-CM | POA: Diagnosis not present

## 2021-11-20 DIAGNOSIS — T7800XD Anaphylactic reaction due to unspecified food, subsequent encounter: Secondary | ICD-10-CM

## 2021-11-20 MED ORDER — FEXOFENADINE HCL 180 MG PO TABS: 180.0000 mg | ORAL_TABLET | Freq: Two times a day (BID) | ORAL | 5 refills | Status: AC | PRN

## 2021-11-20 MED ORDER — XHANCE 93 MCG/ACT NA EXHU: 2.0000 | INHALANT_SUSPENSION | Freq: Two times a day (BID) | NASAL | 5 refills | Status: DC

## 2021-11-20 NOTE — Patient Instructions (Addendum)
1. Seasonal and perennial allergic rhinitis ?- Testing today showed: indoor molds, outdoor molds, dust mites, cat, and dog. ?- Copy of test results provided.  ?- Avoidance measures provided. ?- Continue with: Allegra (fexofenadine) 180mg  tablet once daily ?- Start taking: Xhance (fluticasone) 1-2 sprays per nostril daily  ?- You can use an extra dose of the antihistamine, if needed, for breakthrough symptoms.  ?- Consider nasal saline rinses 1-2 times daily to remove allergens from the nasal cavities as well as help with mucous clearance (this is especially helpful to do before the nasal sprays are given) ?- Consider allergy shots as a means of long-term control. ?- Allergy shots "re-train" and "reset" the immune system to ignore environmental allergens and decrease the resulting immune response to those allergens (sneezing, itchy watery eyes, runny nose, nasal congestion, etc).    ?- Allergy shots improve symptoms in 75-85% of patients.  ?- We can discuss more at the next appointment if the medications are not working for you. ? 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 ?- We are going to get a repeat level to see where it is trending. ?- I do not think that the acupuncture will work since there is no scientific basis for this to work (when it does "work", it is often because people naturally lose their sensitivity over time anyway), but desperate times call for desperate measures!  ?- I have actually had a number of patients get this treatment, then they are told to eat red meat without problems, and then they experience anaphylaxis. ?- We are going to get some more food labs including gums as well as a milk panel.  ?- EpiPen use reviewed. ?- Anaphylaxis management plan provided.  ? ?3. Chronic urticaria ?- I can do more of a workup in the future to check on serious causes of hives, but it seems that meat avoidance has helped a lot. ?- Therefore I do  not think that a larger workup is needed at this time.  ? ?4. Return in about 3 months (around 02/19/2022).  ? ? ?Please inform us of any Emergency Department visits, hospitalizations, or changes in symptoms. Call us before going to the ED for breathing or allergy symptoms since we might be able to fit you in for a sick visit. Feel free to contact us anytime with any questions, problems, or concerns. ? ?It was a pleasure to meet you today! ? ?Websites that have reliable patient information: ?1. American Academy of Asthma, Allergy, and Immunology: www.aaaai.org ?2. Food Allergy Research and Education (FARE): foodallergy.org ?3. Mothers of Asthmatics: http://www.asthmacommunitynetwork.org ?4. [employer] College of Allergy, Asthma, and Immunology: MissingWeapons.cawww.acaai.org ? ? ?COVID-19 Vaccine Information can be found at: [email or web address]/ For questions related to vaccine distribution or appointments, please email [email or web address] or call [phone].  ? ?We realize that you might be concerned about having an allergic reaction to the COVID19 vaccines. To help with that concern, WE ARE OFFERING THE COVID19 VACCINES IN OUR OFFICE! Ask the front desk for dates!  ? ? ? ??Like? us on Facebook and Instagram for our latest updates!  ?  ? ? ?A healthy democracy works best when [employer] voters participate! Make sure you are registered to vote! If you have moved or changed any of your contact information, you will need to get this updated before voting! ? ?In some cases, you MAY be able to register to vote online: [email or web address]-[email or web address]-Vote ? ? ? ?  Airborne Adult Perc - 11/20/21 0930   ? ? Time Antigen Placed 0920   ? Allergen Manufacturer Waynette Buttery   ? Location Back   ? Number of Test 59   ? Panel 1 Select   ? 1. Control-Buffer 50% Glycerol Negative   ? 2. Control-Histamine 1 mg/ml 2+   ? 3. Albumin saline Negative   ? 4. Bahia Negative   ? 5. French Southern Territories Negative    ? 6. Johnson Negative   ? 7. [place] Blue Negative   ? 8. Meadow Fescue Negative   ? 9. Perennial Rye Negative   ? 10. Sweet Vernal Negative   ? 11. Timothy Negative   ? 12. Cocklebur Negative   ? 13. Burweed Marshelder Negative   ? 14. Ragweed, short Negative   ? 15. Ragweed, Giant Negative   ? 16. Plantain,  English Negative   ? 17. Lamb's Quarters Negative   ? 18. Sheep Sorrell Negative   ? 19. Rough Pigweed Negative   ? 20. Marsh Elder, Rough Negative   ? 21. Mugwort, Common Negative   ? 22. Ash mix Negative   ? 23. Charletta Cousin mix Negative   ? 24. Beech American Negative   ? 25. Box, Elder Negative   ? 26. Cedar, red Negative   ? 27. Cottonwood, Guinea[place]Bissau Negative   ? 28. Elm mix Negative   ? 29. Hickory Negative   ? 30. Maple mix Negative   ? 31. Oak, Guinea[place] mix Negative   ? 32. Pecan Pollen Negative   ? 33. Pine mix Negative   ? 34. Sycamore Eastern Negative   ? 35. Walnut, Black Pollen Negative   ? 36. Alternaria alternata Negative   ? 37. Cladosporium Herbarum Negative   ? 38. Aspergillus mix Negative   ? 39. Penicillium mix Negative   ? 40. Bipolaris sorokiniana (Helminthosporium) Negative   ? 41. Drechslera spicifera (Curvularia) Negative   ? 42. Mucor plumbeus Negative   ? 43. Fusarium moniliforme Negative   ? 44. Aureobasidium pullulans (pullulara) Negative   ? 45. Rhizopus oryzae Negative   ? 46. Botrytis cinera Negative   ? 47. Epicoccum nigrum Negative   ? 48. Phoma betae Negative   ? 49. Candida Albicans Negative   ? 50. Trichophyton mentagrophytes Negative   ? 51. Mite, D Farinae  5,000 AU/ml Negative   ? 52. Mite, D Pteronyssinus  5,000 AU/ml Negative   ? 53. Cat Hair 10,000 BAU/ml Negative   ? 54.  Dog Epithelia Negative   ? 55. Mixed Feathers Negative   ? 56. Horse Epithelia Negative   ? 57. Cockroach, [place] Negative   ? 58. Mouse Negative   ? 59. Tobacco Leaf Negative   ? ?  ?  ? ?  ? ? Intradermal - 11/20/21 1006   ? ? Time Antigen Placed 1000   ? Allergen Manufacturer Waynette Buttery   ? Location Arm   ?  Number of Test 15   ? Intradermal Select   ? Control Negative   ? French Southern Territories Negative   ? Johnson Negative   ? 7 Grass Negative   ? Ragweed mix Negative   ? Weed mix Negative   ? Tree mix Negative   ? Mold 1 2+   ? Mold 2 2+   ? Mold 3 2+   ? Mold 4 3+   ? Cat 3+   ? Dog 3+   ? Cockroach Negative   ? Mite mix 4+   ? ?  ?  ? ?  ? ? ?  Control of Mold Allergen  ? ?Mold and fungi can grow on a variety of surfaces provided certain temperature and moisture conditions exist.  Outdoor molds grow on plants, decaying vegetation and soil.  The major outdoor mold, Alternaria and Cladosporium, are found in very high numbers during hot and dry conditions.  Generally, a late Summer - Fall peak is seen for common outdoor fungal spores.  Rain will temporarily lower outdoor mold spore count, but counts rise rapidly when the rainy period ends.  The most important indoor molds are Aspergillus and Penicillium.  Dark, humid and poorly ventilated basements are ideal sites for mold growth.  The next most common sites of mold growth are the bathroom and the kitchen. ? ?Outdoor (Seasonal) Mold Control ? ?Positive outdoor molds via skin testing: Alternaria, Cladosporium, Bipolaris (Helminthsporium), Drechslera (Curvalaria), and Mucor ? ?Use air conditioning and keep windows closed ?Avoid exposure to decaying vegetation. ?Avoid leaf raking. ?Avoid grain handling. ?Consider wearing a face mask if working in moldy areas.  ? ? ?Indoor (Perennial) Mold Control  ? ?Positive indoor molds via skin testing: Aspergillus, Penicillium, Fusarium, Aureobasidium (Pullulara), and Rhizopus ? ?Maintain humidity below 50%. ?Clean washable surfaces with 5% bleach solution. ?Remove sources e.g. contaminated carpets. ? ? ? ?Control of Dust Mite Allergen ? ? ? ?Dust mites play a major role in allergic asthma and rhinitis.  They occur in environments with high humidity wherever human skin is found.  Dust mites absorb humidity from the atmosphere (ie, they do not drink)  and feed on organic matter (including shed human and animal skin).  Dust mites are a microscopic type of insect that you cannot see with the naked eye.  High levels of dust mites have been detected from

## 2021-11-20 NOTE — Progress Notes (Signed)
? ?NEW PATIENT ? ?Date of Service/Encounter:  11/20/21 ? ?Consult requested by: Juliette Alcide, MD ? ? ?Assessment:  ? ?Mild intermittent asthma, uncomplicated ? ?Seasonal and perennial allergic rhinitis ? ?Anaphylactic shock due to food ? ?Chronic urticaria ? ?Plan/Recommendations:  ? ?1. Seasonal and perennial allergic rhinitis ?- Testing today showed: indoor molds, outdoor molds, dust mites, cat, and dog. ?- Copy of test results provided.  ?- Avoidance measures provided. ?- Continue with: Allegra (fexofenadine) 180mg  tablet once daily ?- Start taking: Xhance (fluticasone) 1-2 sprays per nostril daily  ?- You can use an extra dose of the antihistamine, if needed, for breakthrough symptoms.  ?- Consider nasal saline rinses 1-2 times daily to remove allergens from the nasal cavities as well as help with mucous clearance (this is especially helpful to do before the nasal sprays are given) ?- Consider allergy shots as a means of long-term control. ?- Allergy shots "re-train" and "reset" the immune system to ignore environmental allergens and decrease the resulting immune response to those allergens (sneezing, itchy watery eyes, runny nose, nasal congestion, etc).    ?- Allergy shots improve symptoms in 75-85% of patients.  ?- We can discuss more at the next appointment if the medications are not working for you. ? 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 ?- We are going to get a repeat level to see where it is trending. ?- I do not think that the acupuncture will work since there is no scientific basis for this to work (when it does "work", it is often because people naturally lose their sensitivity over time anyway), but desperate times call for desperate measures!  ?- I have actually had a number of patients get this treatment, then they are told to eat red meat without problems, and then they experience anaphylaxis. ?- We are going to get some more  food labs including gums as well as a milk panel.  ?- EpiPen use reviewed. ?- Anaphylaxis management plan provided.  ? ?3. Chronic urticaria ?- I can do more of a workup in the future to check on serious causes of hives, but it seems that meat avoidance has helped a lot. ?- Therefore I do not think that a larger workup is needed at this time.  ? ?4. Return in about 3 months (around 02/19/2022).  ? ? ?This note in its entirety was forwarded to the Provider who requested this consultation. ? ?Subjective:  ? ?Patricia Navarro is a 41 y.o. female presenting today for evaluation of  ?Chief Complaint  ?Patient presents with  ? Asthma  ?  Says that that her alpha gal allergy causes her issues at time. Also sicknesses cause breathing issues. States she has  been sick for the past two or three weeks and it has caused issues with her asthma.  ? Allergic Rhinitis   ?  Says she has issues with cats and pollen and these issues affect her asthma ?  ? Food Intolerance  ?  Dr. 45 confirmed an alpha gal allergy.   ? ? ?Patricia Navarro has a history of the following: ?Patient Active Problem List  ? Diagnosis Date Noted  ? Vesicle of skin 11/01/2021  ? Burning with urination 11/01/2021  ? Hematuria 11/01/2021  ? Abnormal Pap smear of cervix 04/11/2016  ? Elevated cholesterol 04/11/2016  ? History of gestational hypertension 04/04/2016  ? Cervical high risk HPV (human papillomavirus) test  positive 04/04/2015  ? History of shoulder dystocia in prior pregnancy, currently pregnant 03/29/2015  ? ? ?History obtained from: chart review and patient. ? ?Patricia Navarro was referred by Juliette AlcideBurdine, Steven E, MD.    ? ?Patricia DikeJennifer is a 41 y.o. female presenting for an evaluation of allergic rhinitis and asthma as well as food allergies . ? ?Asthma/Respiratory Symptom History: She uses her albuterol intermittently. She gets a refill of her albuterol very rarely. She only has issues when she gets sick. She is not going for more albuterol  monthly.  She did have COVID in January 2021. She did receive monoclonal antibodies for this.  ? ?Allergic Rhinitis Symptom History: She does have a history of allergies and has bene taking Allegra daily.  She does not use a nose spray. Her predominant symptoms is a sinus infection. She takes something rarely to help with that. She is having symptoms year round; typically it has been manageable but her symptoms have been out of control recently.  ? ?Food Allergy Symptom History: She was recently diagnosed with alpha gal. She reports that she has hives as well as chest tightness and throat swelling. She was told that her heart issues might be related to alpha gal.  She saw Dr. Dietrich PatesPaula Ross on April 2023 for evaluation of palpitations. Avoidance of red meats has helped with her symptoms. She thinks that she got the blood work done in February. This was all diagnosed by Dermatology. She was finally sent there because of recurrent urticaria/flushing. She saw Amber, on the of the PAs. She has had issues with ingestion of gums in the oat milks as well, which does the same thing with throat closure. ? ?We did get her results from [facility] and her IgE level to alpha gal was 1.09.  ? ?She has been sent to Rheumatology. She had an elevated ANA and a low B12. She saw Rheumatology but it was not felt that she had autoimmune issues.  ? ?Otherwise, there is no history of other atopic diseases, including drug allergies, stinging insect allergies, urticaria, or contact dermatitis. There is no significant infectious history. Vaccinations are up to date.  ? ? ?Past Medical History: ?Patient Active Problem List  ? Diagnosis Date Noted  ? Vesicle of skin 11/01/2021  ? Burning with urination 11/01/2021  ? Hematuria 11/01/2021  ? Abnormal Pap smear of cervix 04/11/2016  ? Elevated cholesterol 04/11/2016  ? History of gestational hypertension 04/04/2016  ? Cervical high risk HPV (human papillomavirus) test positive 04/04/2015  ? History of  shoulder dystocia in prior pregnancy, currently pregnant 03/29/2015  ? ? ?Medication List:  ?Allergies as of 11/20/2021   ? ?   Reactions  ? Alpha-gal Anaphylaxis, Hives  ? Other Swelling  ? Dairy products cause knot in back of throat  ? ?  ? ?  ?Medication List  ?  ? ?  ? Accurate as of November 20, 2021 10:47 AM. If you have any questions, ask your nurse or doctor.  ?  ?  ? ?  ? ?STOP taking these medications   ? ?cefPROZIL 250 MG tablet ?Commonly known as: CEFZIL ?Stopped by: Alfonse SpruceJoel Louis Clinten Howk, MD ?  ?hydrOXYzine 25 MG capsule ?Commonly known as: VISTARIL ?Stopped by: Alfonse SpruceJoel Louis Kinnedy Mongiello, MD ?  ? ?  ? ?TAKE these medications   ? ?albuterol 108 (90 Base) MCG/ACT inhaler ?Commonly known as: VENTOLIN HFA ?Inhale 2 puffs into the lungs every 6 (six) hours as needed for wheezing or shortness of  breath. ?  ?amoxicillin-clavulanate 875-125 MG tablet ?Commonly known as: AUGMENTIN ?Take 1 tablet by mouth 2 (two) times daily. ?  ?clobetasol cream 0.05 % ?Commonly known as: TEMOVATE ?Apply topically. ?  ?EPINEPHrine 0.3 mg/0.3 mL Soaj injection ?Commonly known as: EPI-PEN ?Inject into the muscle. ?  ?fexofenadine 180 MG tablet ?Commonly known as: ALLEGRA ?Take 180 mg by mouth 2 (two) times daily. ?  ?LORazepam 1 MG tablet ?Commonly known as: Ativan ?Take 0.5 tablets (0.5 mg total) by mouth every 8 (eight) hours as needed for anxiety. ?  ?metoprolol tartrate 25 MG tablet ?Commonly known as: LOPRESSOR ?Take 12.5 mg by mouth 2 (two) times daily. ?  ?VITAMIN B-12 PO ?Take by mouth. Gummy-one daily ?  ? ?  ? ? ?Birth History: non-contributory ? ?Developmental History: non-contributory ? ?Past Surgical History: ?Past Surgical History:  ?Procedure Laterality Date  ? ADENOIDECTOMY    ? EYE SURGERY    ? removal of foreign body, right eye  ? TONSILLECTOMY    ? TUBAL LIGATION Bilateral 02/28/2018  ? Procedure: LAPAROSCOPIC BILATERAL TUBAL LIGATION;  Surgeon: Tilda Burrow, MD;  Location: [facility];  Service: Gynecology;  Laterality:  Bilateral;  ? WISDOM TOOTH EXTRACTION  2019  ? ? ? ?Family History: ?Family History  ?Problem Relation Age of Onset  ? Thyroid disease Mother   ? Heart disease Mother   ? Asthma Mother   ? ADD / ADHD Son   ? He

## 2021-11-22 LAB — ALPHA-GAL PANEL
Allergen Lamb IgE: 0.25 kU/L — AB
Beef IgE: 0.33 kU/L — AB
IgE (Immunoglobulin E), Serum: 27 IU/mL (ref 6–495)
O215-IgE Alpha-Gal: 0.54 kU/L — AB
Pork IgE: 0.2 kU/L — AB

## 2021-11-24 ENCOUNTER — Other Ambulatory Visit: Payer: Self-pay | Admitting: *Deleted

## 2021-11-24 ENCOUNTER — Telehealth: Payer: Self-pay | Admitting: *Deleted

## 2021-11-24 LAB — MILK COMPONENT PANEL
F076-IgE Alpha Lactalbumin: <0.1 kU/L
F077-IgE Beta Lactoglobulin: <0.1 kU/L
F078-IgE Casein: <0.1 kU/L

## 2021-11-24 LAB — XANTHAN GUM IGE
Class Interpretation: 0
Xanthan Gum, IgE*: <0.35 kU/L (ref <0.35)

## 2021-11-24 LAB — C074-IGE GELATIN: C074-IgE Gelatin: <0.1 kU/L

## 2021-11-24 LAB — ALLERGEN GUM CARAGEENAN
Carageenan Gum, IgE: <0.35 kU/L (ref <0.35)
Class Interpretation: 0

## 2021-11-24 LAB — F297-IGE ACACIA GUM: F297-IgE Acacia Gum: <0.1 kU/L

## 2021-11-24 LAB — TRYPTASE: Tryptase: 3.4 ug/L (ref 2.2–13.2)

## 2021-11-24 MED ORDER — AZELASTINE-FLUTICASONE 137-50 MCG/ACT NA SUSP: 1.0000 | Freq: Two times a day (BID) | NASAL | 5 refills | Status: DC

## 2021-11-24 NOTE — Telephone Encounter (Signed)
PA was denied for [facility] because patient does not have a history of nasal polyps. Please advise change in medication.  ?

## 2021-11-24 NOTE — Telephone Encounter (Signed)
I miss Jenna.  ? ?Let's try Dymista one spray per nostril BID. ? ?Malachi Bonds, MD ?[facility] ? ?

## 2021-11-24 NOTE — Telephone Encounter (Signed)
PA has been submitted through CoverMyMeds for Xhance and is currently pending approval/denial.  

## 2021-11-24 NOTE — Telephone Encounter (Signed)
Prescription has been sent in. Called and left a detailed voicemail per [facility] permission.  ?

## 2021-11-30 ENCOUNTER — Encounter: Payer: Self-pay | Admitting: Allergy & Immunology

## 2022-03-02 ENCOUNTER — Ambulatory Visit: Payer: BLUE CROSS/BLUE SHIELD | Admitting: Allergy & Immunology

## 2022-03-02 ENCOUNTER — Encounter: Payer: Self-pay | Admitting: Allergy & Immunology

## 2022-03-02 ENCOUNTER — Other Ambulatory Visit: Payer: Self-pay

## 2022-03-02 VITALS — BP 116/70 | HR 76 | Resp 18

## 2022-03-02 DIAGNOSIS — J3089 Other allergic rhinitis: Secondary | ICD-10-CM

## 2022-03-02 DIAGNOSIS — J302 Other seasonal allergic rhinitis: Secondary | ICD-10-CM

## 2022-03-02 DIAGNOSIS — T7800XD Anaphylactic reaction due to unspecified food, subsequent encounter: Secondary | ICD-10-CM

## 2022-03-02 DIAGNOSIS — L508 Other urticaria: Secondary | ICD-10-CM

## 2022-03-02 DIAGNOSIS — J4520 Mild intermittent asthma, uncomplicated: Secondary | ICD-10-CM | POA: Diagnosis not present

## 2022-03-02 NOTE — Progress Notes (Signed)
FOLLOW UP  Date of Service/Encounter:  03/02/22   Assessment:   Mild intermittent asthma, uncomplicated   Seasonal and perennial allergic rhinitis   Anaphylactic shock due to food   Chronic urticaria  Plan/Recommendations:   1. Seasonal and perennial allergic rhinitis - Previous testing today showed: indoor molds, outdoor molds, dust mites, cat, and dog. - Continue with: Allegra (fexofenadine) 180mg  tablet once daily - Start taking: Ryaltris (olopatadine/mometasone) two sprays per nostril 1-2 times daily as needed - I will check and see whether Ryaltris is mammalian derived and let you know.  - You can use an extra dose of the antihistamine, if needed, for breakthrough symptoms.  - Consider nasal saline rinses 1-2 times daily to remove allergens from the nasal cavities as well as help with mucous clearance (this is especially helpful to do before the nasal sprays are given) - Consider allergy shots as a means of long-term control.  2. 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  - We could consider doing Xolair to help modulate any reactions (and it might allow you to introduce milk into your diet).  - Information on Xolair provided.   3. Return in about 6 months (around 09/02/2022).    Subjective:   Patricia Navarro is a 41 y.o. female presenting today for follow up of  Chief Complaint  Patient presents with  . Asthma  . Allergic Rhinitis     Patricia Navarro has a history of the following: Patient Active Problem List   Diagnosis Date Noted  . Vesicle of skin 11/01/2021  . Burning with urination 11/01/2021  . Hematuria 11/01/2021  . Abnormal Pap smear of cervix 04/11/2016  . Elevated cholesterol 04/11/2016  . History of gestational hypertension 04/04/2016  . Cervical high risk HPV (human papillomavirus) test positive 04/04/2015  . History of shoulder dystocia in prior pregnancy, currently pregnant  03/29/2015    History obtained from: chart review and {Persons; PED relatives w/patient:19415::"patient"}.  Patricia Navarro is a 40 y.o. female presenting for {Blank single:19197::"a food challenge","a drug challenge","skin testing","a sick visit","an evaluation of ***","a follow up visit"}.   {Blank single:19197::"Asthma/Respiratory Symptom History: ***"," "}  Allergic Rhinitis Symptom History: She has been feeling better since she saw 55. She was on steroids and this messed her up fairly badly. She is on Allegra. The sample did help but it was not approved.  We changed her to Dymista. She got this once and then did not get it again. She never really took the Dymista that she did pick up. She is afraid to take it because she does not know whether it has the byproducts in it. Her nose has not been in issue but it was worse in the spring time.   Food Allergy Symptom History: She has not tried dairy.  She has not tried dairy. She was drinking [employer] when this started. She does have an EpiPen.   {Blank single:19197::"Skin Symptom History: ***"," "}  {Blank single:19197::"GERD Symptom History: ***"," "}  Otherwise, there have been no changes to her past medical history, surgical history, family history, or social history.    ROS     Objective:   Blood pressure 116/70, pulse 76, resp. rate 18, SpO2 98 %. There is no height or weight on file to calculate BMI.    Physical Exam   Diagnostic studies: {Blank single:19197::"none","deferred due to recent antihistamine use","labs sent instead"," "}  Spirometry: {Blank single:19197::"results normal (FEV1: ***%, FVC: ***%,  FEV1/FVC: ***%)","results abnormal (FEV1: ***%, FVC: ***%, FEV1/FVC: ***%)"}.    {Blank single:19197::"Spirometry consistent with mild obstructive disease","Spirometry consistent with moderate obstructive disease","Spirometry consistent with severe obstructive disease","Spirometry consistent with possible restrictive  disease","Spirometry consistent with mixed obstructive and restrictive disease","Spirometry uninterpretable due to technique","Spirometry consistent with normal pattern"}. {Blank single:19197::"Albuterol/Atrovent nebulizer","Xopenex/Atrovent nebulizer","Albuterol nebulizer","Albuterol four puffs via MDI","Xopenex four puffs via MDI"} treatment given in clinic with {Blank single:19197::"significant improvement in FEV1 per ATS criteria","significant improvement in FVC per ATS criteria","significant improvement in FEV1 and FVC per ATS criteria","improvement in FEV1, but not significant per ATS criteria","improvement in FVC, but not significant per ATS criteria","improvement in FEV1 and FVC, but not significant per ATS criteria","no improvement"}.  Allergy Studies: {Blank single:19197::"none","labs sent instead"," "}    {Blank single:19197::"Allergy testing results were read and interpreted by myself, documented by clinical staff."," "}      Malachi Bonds, MD  [facility]

## 2022-03-02 NOTE — Patient Instructions (Addendum)
1. Seasonal and perennial allergic rhinitis - Previous testing today showed: indoor molds, outdoor molds, dust mites, cat, and dog. - Continue with: Allegra (fexofenadine) 180mg  tablet once daily - Start taking: Ryaltris (olopatadine/mometasone) two sprays per nostril 1-2 times daily as needed - I will check and see whether Ryaltris is mammalian derived and let you know.  - You can use an extra dose of the antihistamine, if needed, for breakthrough symptoms.  - Consider nasal saline rinses 1-2 times daily to remove allergens from the nasal cavities as well as help with mucous clearance (this is especially helpful to do before the nasal sprays are given) - Consider allergy shots as a means of long-term control.  2. 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  - We could consider doing Xolair to help modulate any reactions (and it might allow you to introduce milk into your diet).  - Information on Xolair provided.   3. Return in about 6 months (around 09/02/2022).    Please inform 09/04/2022 of any Emergency Department visits, hospitalizations, or changes in symptoms. Call us before going to the ED for breathing or allergy symptoms since we might be able to fit you in for a sick visit. Feel free to contact us anytime with any questions, problems, or concerns.  It was a pleasure to see you today!  Websites that have reliable patient information: 1. American Academy of Asthma, Allergy, and Immunology: www.aaaai.org 2. Food Allergy Research and Education (FARE): foodallergy.org 3. Mothers of Asthmatics: http://www.asthmacommunitynetwork.org 4. American College of Allergy, Asthma, and Immunology: www.acaai.org   COVID-19 Vaccine Information can be found at: [place] For questions related to vaccine distribution or appointments, please email [email or web address] or call  [phone].   We realize that you might be concerned about having an allergic reaction to the COVID19 vaccines. To help with that concern, WE ARE OFFERING THE COVID19 VACCINES IN OUR OFFICE! Ask the front desk for dates!     "Like" [phone] on Facebook and Instagram for our latest updates!      A healthy democracy works best when [place] participate! Make sure you are registered to vote! If you have moved or changed any of your contact information, you will need to get this updated before voting!  In some cases, you MAY be able to register to vote online: [employer]       Control of Mold Allergen   Mold and fungi can grow on a variety of surfaces provided certain temperature and moisture conditions exist.  Outdoor molds grow on plants, decaying vegetation and soil.  The major outdoor mold, Alternaria and Cladosporium, are found in very high numbers during hot and dry conditions.  Generally, a late Summer - Fall peak is seen for common outdoor fungal spores.  Rain will temporarily lower outdoor mold spore count, but counts rise rapidly when the rainy period ends.  The most important indoor molds are Aspergillus and Penicillium.  Dark, humid and poorly ventilated basements are ideal sites for mold growth.  The next most common sites of mold growth are the bathroom and the kitchen.  Outdoor (Seasonal) Mold Control  Positive outdoor molds via skin testing: Alternaria, Cladosporium, Bipolaris (Helminthsporium), Drechslera (Curvalaria), and Mucor  Use air conditioning and keep windows closed Avoid exposure to decaying vegetation. Avoid leaf raking. Avoid grain handling. Consider wearing a face mask if working in moldy areas.    Indoor (Perennial) Mold Control   Positive indoor molds via skin  testing: Aspergillus, Penicillium, Fusarium, Aureobasidium (Pullulara), and Rhizopus  Maintain humidity below 50%. Clean washable surfaces with 5% bleach  solution. Remove sources e.g. contaminated carpets.    Control of Dust Mite Allergen    Dust mites play a major role in allergic asthma and rhinitis.  They occur in environments with high humidity wherever human skin is found.  Dust mites absorb humidity from the atmosphere (ie, they do not drink) and feed on organic matter (including shed human and animal skin).  Dust mites are a microscopic type of insect that you cannot see with the naked eye.  High levels of dust mites have been detected from mattresses, pillows, carpets, upholstered furniture, bed covers, clothes, soft toys and any woven material.  The principal allergen of the dust mite is found in its feces.  A gram of dust may contain 1,000 mites and 250,000 fecal particles.  Mite antigen is easily measured in the air during house cleaning activities.  Dust mites do not bite and do not cause harm to humans, other than by triggering allergies/asthma.    Ways to decrease your exposure to dust mites in your home:  Encase mattresses, box springs and pillows with a mite-impermeable barrier or cover   Wash sheets, blankets and drapes weekly in hot water (130 F) with detergent and dry them in a dryer on the hot setting.  Have the room cleaned frequently with a vacuum cleaner and a damp dust-mop.  For carpeting or rugs, vacuuming with a vacuum cleaner equipped with a high-efficiency particulate air (HEPA) filter.  The dust mite allergic individual should not be in a room which is being cleaned and should wait 1 hour after cleaning before going into the room. Do not sleep on upholstered furniture (eg, couches).   If possible removing carpeting, upholstered furniture and drapery from the home is ideal.  Horizontal blinds should be eliminated in the rooms where the person spends the most time (bedroom, study, television room).  Washable vinyl, roller-type shades are optimal. Remove all non-washable stuffed toys from the bedroom.  Wash stuffed toys  weekly like sheets and blankets above.   Reduce indoor humidity to less than 50%.  Inexpensive humidity monitors can be purchased at most hardware stores.  Do not use a humidifier as can make the problem worse and are not recommended.  Control of Dog or Cat Allergen  Avoidance is the best way to manage a dog or cat allergy. If you have a dog or cat and are allergic to dog or cats, consider removing the dog or cat from the home. If you have a dog or cat but don't want to find it a new home, or if your family wants a pet even though someone in the household is allergic, here are some strategies that may help keep symptoms at bay:  Keep the pet out of your bedroom and restrict it to only a few rooms. Be advised that keeping the dog or cat in only one room will not limit the allergens to that room. Don't pet, hug or kiss the dog or cat; if you do, wash your hands with soap and water. High-efficiency particulate air (HEPA) cleaners run continuously in a bedroom or living room can reduce allergen levels over time. Regular use of a high-efficiency vacuum cleaner or a central vacuum can reduce allergen levels. Giving your dog or cat a bath at least once a week can reduce airborne allergen.  Allergy Shots   Allergies are the result of  a chain reaction that starts in the immune system. Your immune system controls how your body defends itself. For instance, if you have an allergy to pollen, your immune system identifies pollen as an invader or allergen. Your immune system overreacts by producing antibodies called Immunoglobulin E (IgE). These antibodies travel to cells that release chemicals, causing an allergic reaction.  The concept behind allergy immunotherapy, whether it is received in the form of shots or tablets, is that the immune system can be desensitized to specific allergens that trigger allergy symptoms. Although it requires time and patience, the payback can be long-term relief.  How Do Allergy  Shots Work?  Allergy shots work much like a vaccine. Your body responds to injected amounts of a particular allergen given in increasing doses, eventually developing a resistance and tolerance to it. Allergy shots can lead to decreased, minimal or no allergy symptoms.  There generally are two phases: build-up and maintenance. Build-up often ranges from three to six months and involves receiving injections with increasing amounts of the allergens. The shots are typically given once or twice a week, though more rapid build-up schedules are sometimes used.  The maintenance phase begins when the most effective dose is reached. This dose is different for each person, depending on how allergic you are and your response to the build-up injections. Once the maintenance dose is reached, there are longer periods between injections, typically two to four weeks.  Occasionally doctors give cortisone-type shots that can temporarily reduce allergy symptoms. These types of shots are different and should not be confused with allergy immunotherapy shots.  Who Can Be Treated with Allergy Shots?  Allergy shots may be a good treatment approach for people with allergic rhinitis (hay fever), allergic asthma, conjunctivitis (eye allergy) or stinging insect allergy.   Before deciding to begin allergy shots, you should consider:   The length of allergy season and the severity of your symptoms  Whether medications and/or changes to your environment can control your symptoms  Your desire to avoid long-term medication use  Time: allergy immunotherapy requires a major time commitment  Cost: may vary depending on your insurance coverage  Allergy shots for children age 9 and older are effective and often well tolerated. They might prevent the onset of new allergen sensitivities or the progression to asthma.  Allergy shots are not started on patients who are pregnant but can be continued on patients who become pregnant  while receiving them. In some patients with other medical conditions or who take certain common medications, allergy shots may be of risk. It is important to mention other medications you talk to your allergist.   When Will I Feel Better?  Some may experience decreased allergy symptoms during the build-up phase. For others, it may take as long as 12 months on the maintenance dose. If there is no improvement after a year of maintenance, your allergist will discuss other treatment options with you.  If you aren't responding to allergy shots, it may be because there is not enough dose of the allergen in your vaccine or there are missing allergens that were not identified during your allergy testing. Other reasons could be that there are high levels of the allergen in your environment or major exposure to non-allergic triggers like tobacco smoke.  What Is the Length of Treatment?  Once the maintenance dose is reached, allergy shots are generally continued for three to five years. The decision to stop should be discussed with your allergist at that time.  Some people may experience a permanent reduction of allergy symptoms. Others may relapse and a longer course of allergy shots can be considered.  What Are the Possible Reactions?  The two types of adverse reactions that can occur with allergy shots are local and systemic. Common local reactions include very mild redness and swelling at the injection site, which can happen immediately or several hours after. A systemic reaction, which is less common, affects the entire body or a particular body system. They are usually mild and typically respond quickly to medications. Signs include increased allergy symptoms such as sneezing, a stuffy nose or hives.  Rarely, a serious systemic reaction called anaphylaxis can develop. Symptoms include swelling in the throat, wheezing, a feeling of tightness in the chest, nausea or dizziness. Most serious systemic reactions  develop within 30 minutes of allergy shots. This is why it is strongly recommended you wait in your doctor's office for 30 minutes after your injections. Your allergist is trained to watch for reactions, and his or her staff is trained and equipped with the proper medications to identify and treat them.  Who Should Administer Allergy Shots?  The preferred location for receiving shots is your prescribing allergist's office. Injections can sometimes be given at another facility where the physician and staff are trained to recognize and treat reactions, and have received instructions by your prescribing allergist.

## 2022-09-05 ENCOUNTER — Ambulatory Visit: Payer: BLUE CROSS/BLUE SHIELD | Admitting: Allergy & Immunology

## 2023-07-10 IMAGING — MG MM DIGITAL SCREENING BILAT W/ TOMO AND CAD
8 series · 8 of 24 positions shown · non-contrast
Comparison: Previous exam(s).

CLINICAL DATA: Screening.

EXAM:
DIGITAL SCREENING BILATERAL MAMMOGRAM WITH TOMOSYNTHESIS AND CAD
TECHNIQUE: Bilateral screening digital craniocaudal and mediolateral oblique
mammograms were obtained. Bilateral screening digital breast
tomosynthesis was performed. The images were evaluated with
computer-aided detection.

[R MLO synth-2D]
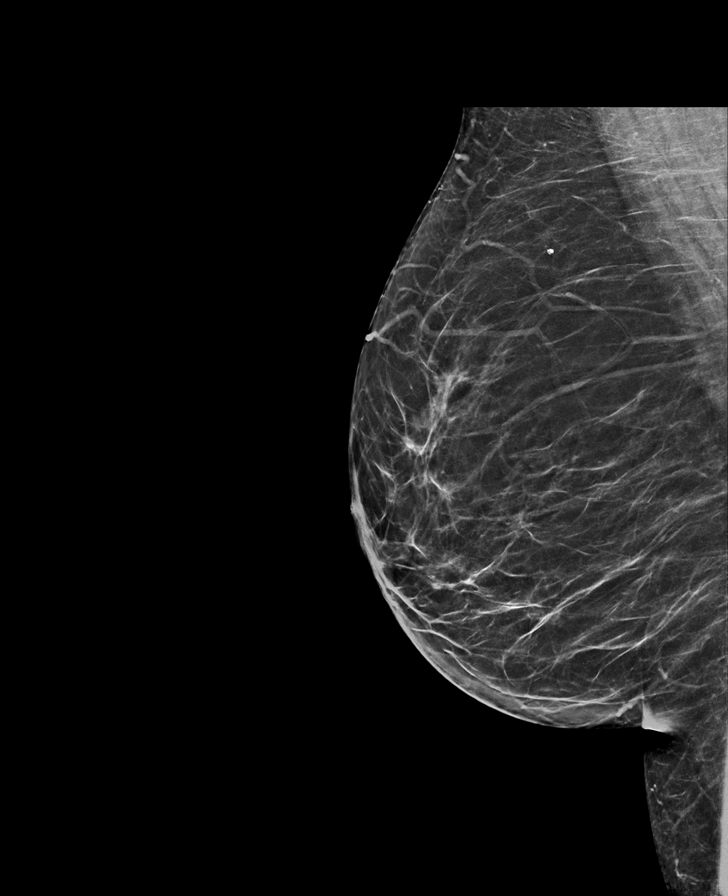

[L MLO synth-2D]
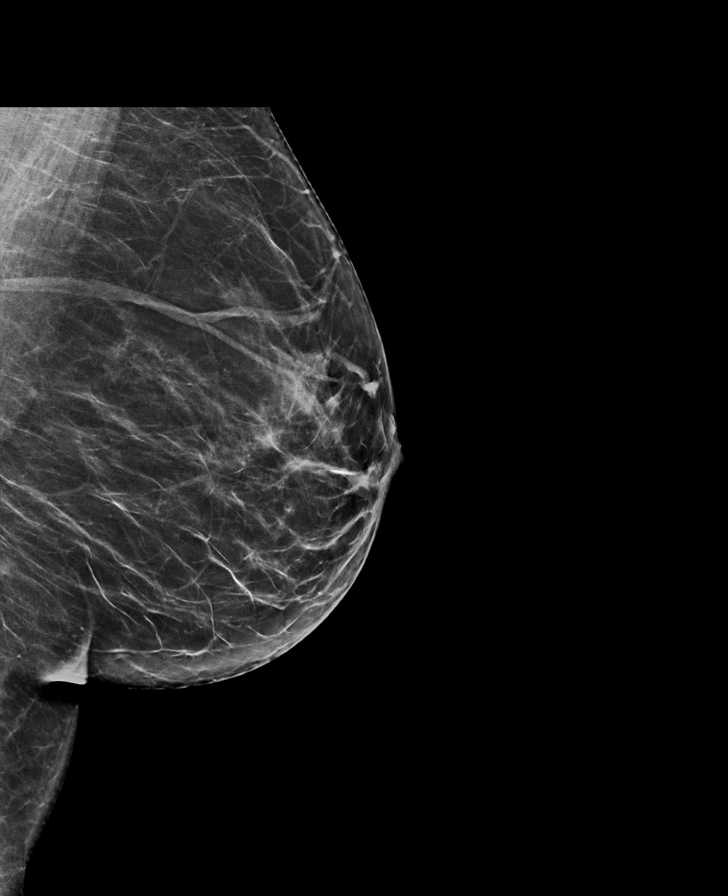

[R CC synth-2D]
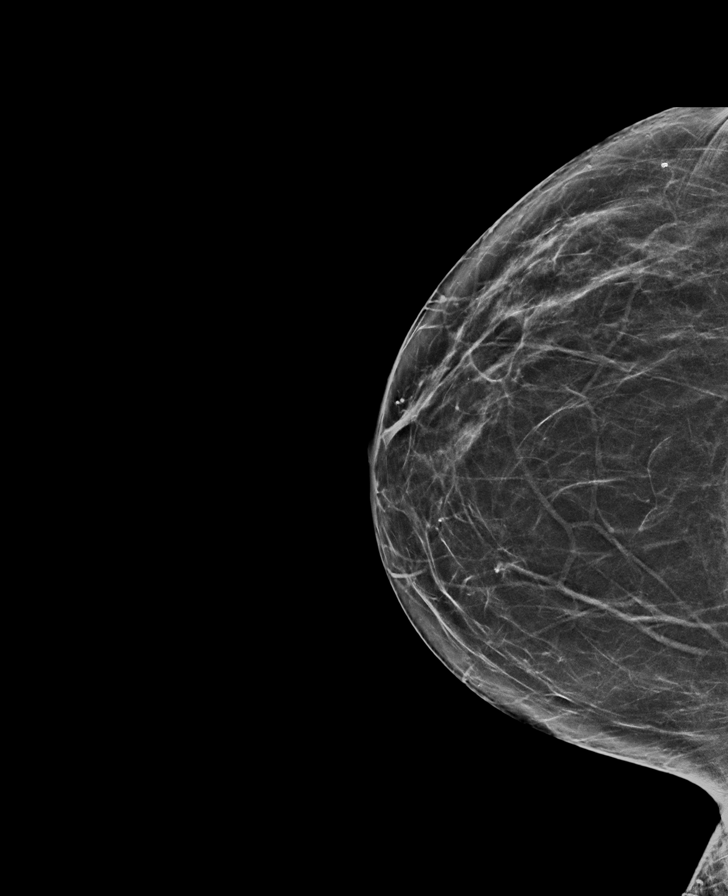

[L CC synth-2D]
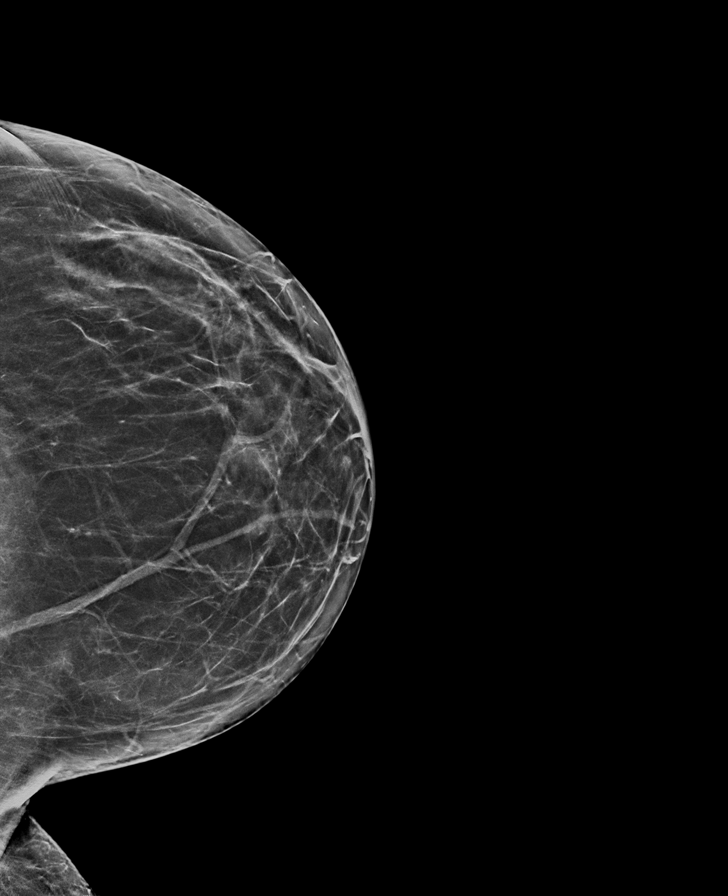

[L CC tomo · tomo slice 33/66.0]
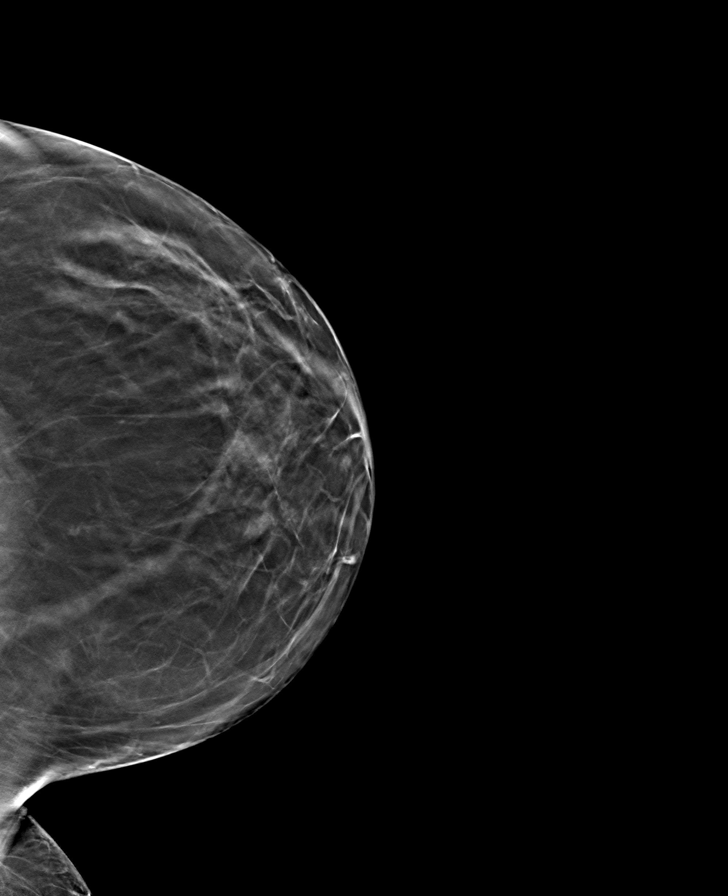

[R MLO tomo · tomo slice 33/65.0]
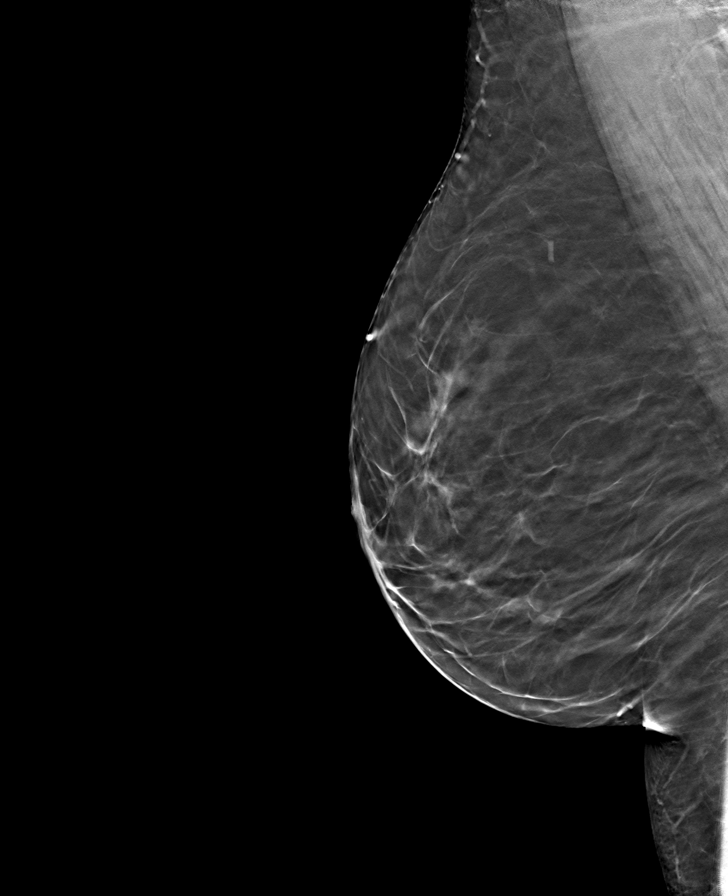

[R CC tomo · tomo slice 33/64.0]
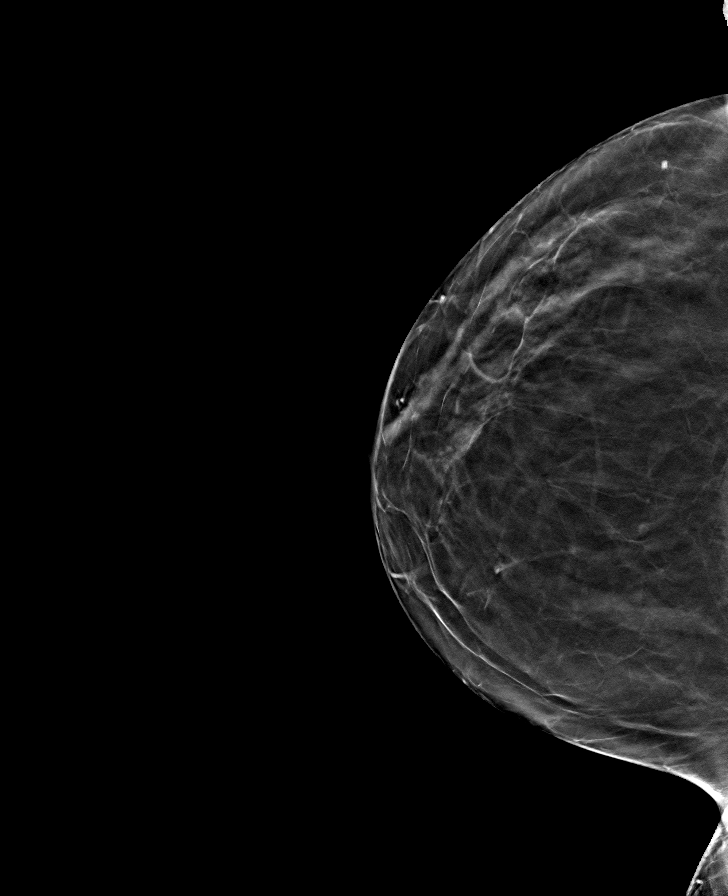

[L MLO tomo · tomo slice 35/68.0]
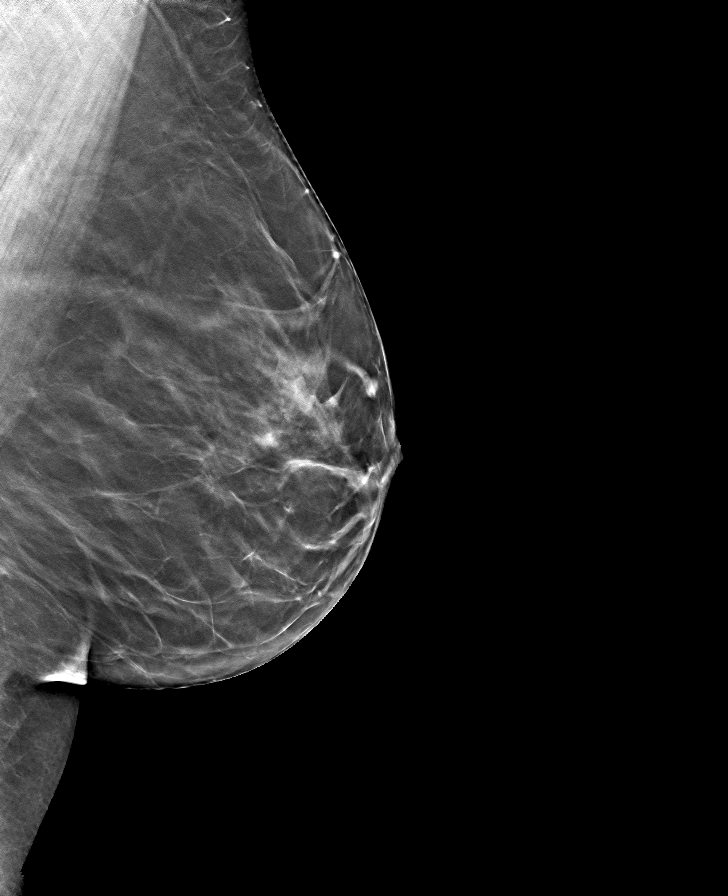

[8 of 24 positions shown; findings below may reference images not displayed]

ACR Breast Density Category b: There are scattered areas of
fibroglandular density.
FINDINGS: There are no findings suspicious for malignancy.
IMPRESSION: No mammographic evidence of malignancy. A result letter of this
screening mammogram will be mailed directly to the patient.

RECOMMENDATION:
Screening mammogram in one year. (Code:51-O-LD2)

BI-RADS CATEGORY  1: Negative.

## 2023-11-21 ENCOUNTER — Other Ambulatory Visit (HOSPITAL_COMMUNITY): Payer: Self-pay | Admitting: Family Medicine

## 2023-11-21 ENCOUNTER — Encounter: Payer: Self-pay | Admitting: *Deleted

## 2023-11-21 DIAGNOSIS — Z1231 Encounter for screening mammogram for malignant neoplasm of breast: Secondary | ICD-10-CM

## 2023-11-29 ENCOUNTER — Ambulatory Visit (HOSPITAL_COMMUNITY)

## 2024-01-02 ENCOUNTER — Telehealth: Payer: Self-pay | Admitting: Internal Medicine

## 2024-01-02 NOTE — Telephone Encounter (Signed)
 Received colonoscopy questionnaire and she needs to come in for an office visit before the procedure is scheduled.  I left a message asking her to call the office to schedule.

## 2024-01-30 ENCOUNTER — Ambulatory Visit: Payer: PRIVATE HEALTH INSURANCE | Admitting: Internal Medicine

## 2024-02-19 ENCOUNTER — Encounter: Payer: Self-pay | Admitting: Internal Medicine

## 2024-02-19 ENCOUNTER — Encounter: Payer: Self-pay | Admitting: *Deleted

## 2024-02-19 ENCOUNTER — Other Ambulatory Visit: Payer: Self-pay | Admitting: *Deleted

## 2024-02-19 ENCOUNTER — Ambulatory Visit (INDEPENDENT_AMBULATORY_CARE_PROVIDER_SITE_OTHER): Payer: PRIVATE HEALTH INSURANCE | Admitting: Internal Medicine

## 2024-02-19 VITALS — BP 118/82 | HR 85 | Temp 97.8°F | Ht 64.0 in | Wt 184.8 lb

## 2024-02-19 DIAGNOSIS — Z09 Encounter for follow-up examination after completed treatment for conditions other than malignant neoplasm: Secondary | ICD-10-CM

## 2024-02-19 DIAGNOSIS — Z91014 Allergy to mammalian meats: Secondary | ICD-10-CM | POA: Diagnosis not present

## 2024-02-19 DIAGNOSIS — Z800 Family history of malignant neoplasm of digestive organs: Secondary | ICD-10-CM

## 2024-02-19 MED ORDER — PEG 3350-KCL-NA BICARB-NACL 420 G PO SOLR: 4000.0000 mL | Freq: Once | ORAL | 0 refills | Status: AC

## 2024-02-19 NOTE — Progress Notes (Signed)
 Since she has alpha gal, will only do prep without the dulcolax, enema, simethicone  gas drops

## 2024-02-19 NOTE — H&P (View-Only) (Signed)
 Primary Care Physician:  Lari Elspeth BRAVO, MD Primary Gastroenterologist:  Dr. Cindie  Chief Complaint  Patient presents with   New Patient (Initial Visit)    Patient here today due to having issues with hemorrhoids and has a family history of colon cancer.    HPI:   Patricia Navarro is a 43 y.o. female who presents to the clinic today by referral from her PCP Rocky Don to discuss colonoscopy.  Patient report family history of colon cancer in father, Higinio, Apolinar. No prior colonoscopy. No abdominal pain.  No unintentional weight loss.  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  Initially diagnosed 2023.  Currently avoiding beef pork and lamb.  She is concerned about colon preparation prior to colonoscopy.  Denies any upper GI symptoms including heartburn, reflux, dysphagia/odynophagia, epigastric or chest pain.   Past Medical History:  Diagnosis Date   Asthma    Hypertension    Hypoglycemia    Seizures (HCC)    had 1 seizure from hypoglycemia in early 20's. No meds and no more seizures since then.    Past Surgical History:  Procedure Laterality Date   ADENOIDECTOMY     EYE SURGERY     removal of foreign body, right eye   TONSILLECTOMY     TUBAL LIGATION Bilateral 02/28/2018   Procedure: LAPAROSCOPIC BILATERAL TUBAL LIGATION;  Surgeon: Edsel Norleen GAILS, MD;  Location: [facility];  Service: Gynecology;  Laterality: Bilateral;   WISDOM TOOTH EXTRACTION  2019    Current Outpatient Medications  Medication Sig Dispense Refill   albuterol  (PROVENTIL  HFA;VENTOLIN  HFA) 108 (90 BASE) MCG/ACT inhaler Inhale 2 puffs into the lungs every 6 (six) hours as needed for wheezing or shortness of breath. 1 Inhaler 2   Cyanocobalamin (VITAMIN B-12 PO) Take by mouth. Gummy-one daily (Patient taking differently: Take by mouth. Usually as needed.)     EPINEPHrine 0.3 mg/0.3 mL IJ SOAJ injection Inject into the muscle. (Patient taking differently: Inject  into the muscle as needed.)     fexofenadine  (ALLEGRA ) 180 MG tablet Take 1 tablet (180 mg total) by mouth 2 (two) times daily as needed for allergies or rhinitis (Can take an extra dose during flare ups.). 60 tablet 5   LORazepam  (ATIVAN ) 1 MG tablet Take 0.5 tablets (0.5 mg total) by mouth every 8 (eight) hours as needed for anxiety. 5 tablet 0   No current facility-administered medications for this visit.    Allergies as of 02/19/2024 - Review Complete 02/19/2024  Allergen Reaction Noted   Alpha-gal Anaphylaxis and Hives 09/06/2021   Other Swelling 10/25/2021    Family History  Problem Relation Age of Onset   Thyroid disease Mother    Heart disease Mother    Asthma Mother    Colon cancer Father    Colon cancer Maternal Uncle    Heart attack Maternal Grandmother    Thyroid disease Maternal Grandmother    Cancer Maternal Grandfather    Cancer Paternal Grandmother        breast   Other Paternal Grandmother        aneursym   Emphysema Paternal Grandfather    ADD / ADHD Son    Colon cancer Maternal Grandparent     Social History   Socioeconomic History   Marital status: Married    Spouse name: Not on file   Number of children: Not on file   Years of education: Not on file   Highest education level: Not on  file  Occupational History   Not on file  Tobacco Use   Smoking status: Never   Smokeless tobacco: Never  Vaping Use   Vaping status: Never Used  Substance and Sexual Activity   Alcohol use: No   Drug use: No   Sexual activity: Yes    Partners: Male    Birth control/protection: Surgical    Comment: tubal  Other Topics Concern   Not on file  Social History Narrative   Not on file   Social Drivers of Health   Financial Resource Strain: Low Risk  (10/25/2021)   Overall Financial Resource Strain (CARDIA)    Difficulty of Paying Living Expenses: Not very hard  Food Insecurity: No Food Insecurity (10/25/2021)   Hunger Vital Sign    Worried About Running Out of  Food in the Last Year: Never true    Ran Out of Food in the Last Year: Never true  Transportation Needs: No Transportation Needs (10/25/2021)   PRAPARE - Transportation    Lack of Transportation (Medical): No    Lack of Transportation (Non-Medical): No  Physical Activity: Sufficiently Active (10/25/2021)   Exercise Vital Sign    Days of Exercise per Week: 5 days    Minutes of Exercise per Session: 30 min  Stress: Stress Concern Present (10/25/2021)   [employer] of Occupational Health - Occupational Stress Questionnaire    Feeling of Stress : Rather much  Social Connections: Unknown (10/25/2021)   Social Connection and Isolation Panel    Frequency of Communication with Friends and Family: More than three times a week    Frequency of Social Gatherings with Friends and Family: Once a week    Attends Religious Services: Patient declined    Database administrator or Organizations: No    Attends Banker Meetings: Never    Marital Status: Married  Catering manager Violence: Not At Risk (10/25/2021)   Humiliation, Afraid, Rape, and Kick questionnaire    Fear of Current or Ex-Partner: No    Emotionally Abused: No    Physically Abused: No    Sexually Abused: No    Subjective: Review of Systems  Constitutional:  Negative for chills and fever.  HENT:  Negative for congestion and hearing loss.   Eyes:  Negative for blurred vision and double vision.  Respiratory:  Negative for cough and shortness of breath.   Cardiovascular:  Negative for chest pain and palpitations.  Gastrointestinal:  Negative for abdominal pain, blood in stool, constipation, diarrhea, heartburn, melena and vomiting.  Genitourinary:  Negative for dysuria and urgency.  Musculoskeletal:  Negative for joint pain and myalgias.  Skin:  Negative for itching and rash.  Neurological:  Negative for dizziness and headaches.  Psychiatric/Behavioral:  Negative for depression. The patient is not nervous/anxious.         Objective: BP 118/82 (BP Location: Left Arm, Patient Position: Sitting, Cuff Size: Normal)   Pulse 85   Temp 97.8 F (36.6 C) (Temporal)   Ht 5' 4 (1.626 m)   Wt 184 lb 12.8 oz (83.8 kg)   BMI 31.72 kg/m  Physical Exam Constitutional:      Appearance: Normal appearance.  HENT:     Head: Normocephalic and atraumatic.  Eyes:     Extraocular Movements: Extraocular movements intact.     Conjunctiva/sclera: Conjunctivae normal.  Cardiovascular:     Rate and Rhythm: Normal rate and regular rhythm.  Pulmonary:     Effort: Pulmonary effort is normal.  Breath sounds: Normal breath sounds.  Abdominal:     General: Bowel sounds are normal.     Palpations: Abdomen is soft.  Musculoskeletal:        General: No swelling. Normal range of motion.     Cervical back: Normal range of motion and neck supple.  Skin:    General: Skin is warm and dry.     Coloration: Skin is not jaundiced.  Neurological:     General: No focal deficit present.     Mental Status: She is alert and oriented to person, place, and time.  Psychiatric:        Mood and Affect: Mood normal.        Behavior: Behavior normal.      Assessment: *Family history of colon cancer-father, uncle, grandfather *Alpha gal syndrome  Plan: Will schedule for high risk screening colonoscopy.The risks including infection, bleed, or perforation as well as benefits, limitations, alternatives and imponderables have been reviewed with the patient. Questions have been answered. All parties agreeable.  Counseled patient she will need to have colonoscopy at least every 5 years.  Not a candidate for Cologuard testing.  Given her severe alpha gal allergy , we will use PEG-based colon preparation.  Continue to avoid meat pork and lamb as well as mammalian derivatives.  Thank you Rocky Don for the kind referral.   02/19/2024 8:45 AM   Disclaimer: This note was dictated with voice recognition software. Similar sounding  words can inadvertently be transcribed and may not be corrected upon review.

## 2024-02-19 NOTE — Patient Instructions (Signed)
 We will schedule you for colonoscopy given your family history.  I will also evaluate your hemorrhoids and rectal bleeding.  We will use a completely synthetic colon prep without mammalian derivatives.  It was very nice meeting you today.  Dr. Cindie

## 2024-02-19 NOTE — Progress Notes (Signed)
 Primary Care Physician:  Lari Elspeth BRAVO, MD Primary Gastroenterologist:  Dr. Cindie  Chief Complaint  Patient presents with   New Patient (Initial Visit)    Patient here today due to having issues with hemorrhoids and has a family history of colon cancer.    HPI:   Patricia Navarro is a 43 y.o. female who presents to the clinic today by referral from her PCP Rocky Don to discuss colonoscopy.  Patient report family history of colon cancer in father, Higinio, Apolinar. No prior colonoscopy. No abdominal pain.  No unintentional weight loss.  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  Initially diagnosed 2023.  Currently avoiding beef pork and lamb.  She is concerned about colon preparation prior to colonoscopy.  Denies any upper GI symptoms including heartburn, reflux, dysphagia/odynophagia, epigastric or chest pain.   Past Medical History:  Diagnosis Date   Asthma    Hypertension    Hypoglycemia    Seizures (HCC)    had 1 seizure from hypoglycemia in early 20's. No meds and no more seizures since then.    Past Surgical History:  Procedure Laterality Date   ADENOIDECTOMY     EYE SURGERY     removal of foreign body, right eye   TONSILLECTOMY     TUBAL LIGATION Bilateral 02/28/2018   Procedure: LAPAROSCOPIC BILATERAL TUBAL LIGATION;  Surgeon: Edsel Norleen GAILS, MD;  Location: [facility];  Service: Gynecology;  Laterality: Bilateral;   WISDOM TOOTH EXTRACTION  2019    Current Outpatient Medications  Medication Sig Dispense Refill   albuterol  (PROVENTIL  HFA;VENTOLIN  HFA) 108 (90 BASE) MCG/ACT inhaler Inhale 2 puffs into the lungs every 6 (six) hours as needed for wheezing or shortness of breath. 1 Inhaler 2   Cyanocobalamin (VITAMIN B-12 PO) Take by mouth. Gummy-one daily (Patient taking differently: Take by mouth. Usually as needed.)     EPINEPHrine 0.3 mg/0.3 mL IJ SOAJ injection Inject into the muscle. (Patient taking differently: Inject  into the muscle as needed.)     fexofenadine  (ALLEGRA ) 180 MG tablet Take 1 tablet (180 mg total) by mouth 2 (two) times daily as needed for allergies or rhinitis (Can take an extra dose during flare ups.). 60 tablet 5   LORazepam  (ATIVAN ) 1 MG tablet Take 0.5 tablets (0.5 mg total) by mouth every 8 (eight) hours as needed for anxiety. 5 tablet 0   No current facility-administered medications for this visit.    Allergies as of 02/19/2024 - Review Complete 02/19/2024  Allergen Reaction Noted   Alpha-gal Anaphylaxis and Hives 09/06/2021   Other Swelling 10/25/2021    Family History  Problem Relation Age of Onset   Thyroid disease Mother    Heart disease Mother    Asthma Mother    Colon cancer Father    Colon cancer Maternal Uncle    Heart attack Maternal Grandmother    Thyroid disease Maternal Grandmother    Cancer Maternal Grandfather    Cancer Paternal Grandmother        breast   Other Paternal Grandmother        aneursym   Emphysema Paternal Grandfather    ADD / ADHD Son    Colon cancer Maternal Grandparent     Social History   Socioeconomic History   Marital status: Married    Spouse name: Not on file   Number of children: Not on file   Years of education: Not on file   Highest education level: Not on  file  Occupational History   Not on file  Tobacco Use   Smoking status: Never   Smokeless tobacco: Never  Vaping Use   Vaping status: Never Used  Substance and Sexual Activity   Alcohol use: No   Drug use: No   Sexual activity: Yes    Partners: Male    Birth control/protection: Surgical    Comment: tubal  Other Topics Concern   Not on file  Social History Narrative   Not on file   Social Drivers of Health   Financial Resource Strain: Low Risk  (10/25/2021)   Overall Financial Resource Strain (CARDIA)    Difficulty of Paying Living Expenses: Not very hard  Food Insecurity: No Food Insecurity (10/25/2021)   Hunger Vital Sign    Worried About Running Out of  Food in the Last Year: Never true    Ran Out of Food in the Last Year: Never true  Transportation Needs: No Transportation Needs (10/25/2021)   PRAPARE - Transportation    Lack of Transportation (Medical): No    Lack of Transportation (Non-Medical): No  Physical Activity: Sufficiently Active (10/25/2021)   Exercise Vital Sign    Days of Exercise per Week: 5 days    Minutes of Exercise per Session: 30 min  Stress: Stress Concern Present (10/25/2021)   [employer] of Occupational Health - Occupational Stress Questionnaire    Feeling of Stress : Rather much  Social Connections: Unknown (10/25/2021)   Social Connection and Isolation Panel    Frequency of Communication with Friends and Family: More than three times a week    Frequency of Social Gatherings with Friends and Family: Once a week    Attends Religious Services: Patient declined    Database administrator or Organizations: No    Attends Banker Meetings: Never    Marital Status: Married  Catering manager Violence: Not At Risk (10/25/2021)   Humiliation, Afraid, Rape, and Kick questionnaire    Fear of Current or Ex-Partner: No    Emotionally Abused: No    Physically Abused: No    Sexually Abused: No    Subjective: Review of Systems  Constitutional:  Negative for chills and fever.  HENT:  Negative for congestion and hearing loss.   Eyes:  Negative for blurred vision and double vision.  Respiratory:  Negative for cough and shortness of breath.   Cardiovascular:  Negative for chest pain and palpitations.  Gastrointestinal:  Negative for abdominal pain, blood in stool, constipation, diarrhea, heartburn, melena and vomiting.  Genitourinary:  Negative for dysuria and urgency.  Musculoskeletal:  Negative for joint pain and myalgias.  Skin:  Negative for itching and rash.  Neurological:  Negative for dizziness and headaches.  Psychiatric/Behavioral:  Negative for depression. The patient is not nervous/anxious.         Objective: BP 118/82 (BP Location: Left Arm, Patient Position: Sitting, Cuff Size: Normal)   Pulse 85   Temp 97.8 F (36.6 C) (Temporal)   Ht 5' 4 (1.626 m)   Wt 184 lb 12.8 oz (83.8 kg)   BMI 31.72 kg/m  Physical Exam Constitutional:      Appearance: Normal appearance.  HENT:     Head: Normocephalic and atraumatic.  Eyes:     Extraocular Movements: Extraocular movements intact.     Conjunctiva/sclera: Conjunctivae normal.  Cardiovascular:     Rate and Rhythm: Normal rate and regular rhythm.  Pulmonary:     Effort: Pulmonary effort is normal.  Breath sounds: Normal breath sounds.  Abdominal:     General: Bowel sounds are normal.     Palpations: Abdomen is soft.  Musculoskeletal:        General: No swelling. Normal range of motion.     Cervical back: Normal range of motion and neck supple.  Skin:    General: Skin is warm and dry.     Coloration: Skin is not jaundiced.  Neurological:     General: No focal deficit present.     Mental Status: She is alert and oriented to person, place, and time.  Psychiatric:        Mood and Affect: Mood normal.        Behavior: Behavior normal.      Assessment: *Family history of colon cancer-father, uncle, grandfather *Alpha gal syndrome  Plan: Will schedule for high risk screening colonoscopy.The risks including infection, bleed, or perforation as well as benefits, limitations, alternatives and imponderables have been reviewed with the patient. Questions have been answered. All parties agreeable.  Counseled patient she will need to have colonoscopy at least every 5 years.  Not a candidate for Cologuard testing.  Given her severe alpha gal allergy , we will use PEG-based colon preparation.  Continue to avoid meat pork and lamb as well as mammalian derivatives.  Thank you Rocky Don for the kind referral.   02/19/2024 8:45 AM   Disclaimer: This note was dictated with voice recognition software. Similar sounding  words can inadvertently be transcribed and may not be corrected upon review.

## 2024-03-02 ENCOUNTER — Encounter (HOSPITAL_COMMUNITY): Payer: Self-pay | Admitting: Family Medicine

## 2024-03-03 ENCOUNTER — Other Ambulatory Visit (HOSPITAL_COMMUNITY): Payer: Self-pay | Admitting: Family Medicine

## 2024-03-03 DIAGNOSIS — N644 Mastodynia: Secondary | ICD-10-CM

## 2024-03-10 ENCOUNTER — Other Ambulatory Visit: Payer: Self-pay | Admitting: Medical Genetics

## 2024-03-13 ENCOUNTER — Other Ambulatory Visit (HOSPITAL_COMMUNITY)
Admission: RE | Admit: 2024-03-13 | Discharge: 2024-03-13 | Disposition: A | Discharge status: Home / Self Care | Payer: Self-pay | Source: Ambulatory Visit | Attending: Oncology | Admitting: Oncology

## 2024-03-13 ENCOUNTER — Other Ambulatory Visit (HOSPITAL_COMMUNITY)
Admission: RE | Admit: 2024-03-13 | Discharge: 2024-03-13 | Disposition: A | Discharge status: Home / Self Care | Payer: Self-pay | Source: Ambulatory Visit | Attending: Internal Medicine | Admitting: Internal Medicine

## 2024-03-13 DIAGNOSIS — Z3202 Encounter for pregnancy test, result negative: Secondary | ICD-10-CM | POA: Diagnosis not present

## 2024-03-13 DIAGNOSIS — Z800 Family history of malignant neoplasm of digestive organs: Secondary | ICD-10-CM | POA: Insufficient documentation

## 2024-03-13 DIAGNOSIS — Z91018 Allergy to other foods: Secondary | ICD-10-CM | POA: Insufficient documentation

## 2024-03-13 LAB — PREGNANCY, URINE: Preg Test, Ur: NEGATIVE

## 2024-03-17 ENCOUNTER — Ambulatory Visit (HOSPITAL_COMMUNITY): Admitting: Anesthesiology

## 2024-03-17 ENCOUNTER — Ambulatory Visit (HOSPITAL_COMMUNITY)
Admission: RE | Admit: 2024-03-17 | Discharge: 2024-03-17 | Disposition: A | Discharge status: Home / Self Care | Payer: PRIVATE HEALTH INSURANCE | Attending: Internal Medicine | Admitting: Internal Medicine

## 2024-03-17 ENCOUNTER — Other Ambulatory Visit: Payer: Self-pay

## 2024-03-17 ENCOUNTER — Encounter (HOSPITAL_COMMUNITY)
Admission: RE | Disposition: A | Discharge status: Home / Self Care | Payer: Self-pay | Source: Home / Self Care | Attending: Internal Medicine

## 2024-03-17 ENCOUNTER — Encounter (HOSPITAL_COMMUNITY): Payer: Self-pay | Admitting: Internal Medicine

## 2024-03-17 ENCOUNTER — Telehealth: Payer: Self-pay | Admitting: Gastroenterology

## 2024-03-17 DIAGNOSIS — J45909 Unspecified asthma, uncomplicated: Secondary | ICD-10-CM | POA: Diagnosis not present

## 2024-03-17 DIAGNOSIS — I10 Essential (primary) hypertension: Secondary | ICD-10-CM

## 2024-03-17 DIAGNOSIS — K644 Residual hemorrhoidal skin tags: Secondary | ICD-10-CM

## 2024-03-17 DIAGNOSIS — Z1211 Encounter for screening for malignant neoplasm of colon: Secondary | ICD-10-CM

## 2024-03-17 DIAGNOSIS — D122 Benign neoplasm of ascending colon: Secondary | ICD-10-CM | POA: Diagnosis not present

## 2024-03-17 DIAGNOSIS — K648 Other hemorrhoids: Secondary | ICD-10-CM | POA: Diagnosis not present

## 2024-03-17 DIAGNOSIS — Z800 Family history of malignant neoplasm of digestive organs: Secondary | ICD-10-CM | POA: Insufficient documentation

## 2024-03-17 DIAGNOSIS — K635 Polyp of colon: Secondary | ICD-10-CM

## 2024-03-17 HISTORY — PX: COLONOSCOPY: SHX5424

## 2024-03-17 SURGERY — COLONOSCOPY
Anesthesia: General

## 2024-03-17 MED ORDER — STERILE WATER FOR IRRIGATION IR SOLN
Status: DC | PRN
  Administered 2024-03-17 (×10): 60 mL

## 2024-03-17 MED ORDER — LACTATED RINGERS IV SOLN: INTRAVENOUS | Status: DC

## 2024-03-17 MED ORDER — PROPOFOL 500 MG/50ML IV EMUL
INTRAVENOUS | Status: DC | PRN
  Administered 2024-03-17 (×2): 200 ug/kg/min via INTRAVENOUS
  Administered 2024-03-17: 50 mg via INTRAVENOUS
  Administered 2024-03-17: 175 ug/kg/min via INTRAVENOUS
  Administered 2024-03-17: 50 mg via INTRAVENOUS
  Administered 2024-03-17: 175 ug/kg/min via INTRAVENOUS
  Administered 2024-03-17 (×2): 150 mg via INTRAVENOUS

## 2024-03-17 NOTE — Anesthesia Preprocedure Evaluation (Signed)
 Anesthesia Evaluation  Patient identified by MRN, date of birth, ID band Patient awake    Reviewed: Allergy  & Precautions, H&P , NPO status , Patient's Chart, lab work & pertinent test results, reviewed documented beta blocker date and time   Airway Mallampati: II  TM Distance: >3 FB Neck ROM: full    Dental no notable dental hx. (+) Dental Advisory Given, Teeth Intact   Pulmonary asthma    Pulmonary exam normal breath sounds clear to auscultation       Cardiovascular Exercise Tolerance: Good hypertension, Normal cardiovascular exam Rhythm:regular Rate:Normal     Neuro/Psych Seizures -,   negative psych ROS   GI/Hepatic negative GI ROS, Neg liver ROS,,,  Endo/Other  negative endocrine ROS    Renal/GU negative Renal ROS  negative genitourinary   Musculoskeletal   Abdominal   Peds  Hematology negative hematology ROS (+)   Anesthesia Other Findings   Reproductive/Obstetrics negative OB ROS                              Anesthesia Physical Anesthesia Plan  ASA: 3  Anesthesia Plan: General   Post-op Pain Management: Minimal or no pain anticipated   Induction: Intravenous  PONV Risk Score and Plan: Propofol  infusion  Airway Management Planned: Natural Airway and Nasal Cannula  Additional Equipment: None  Intra-op Plan:   Post-operative Plan:   Informed Consent: I have reviewed the patients History and Physical, chart, labs and discussed the procedure including the risks, benefits and alternatives for the proposed anesthesia with the patient or authorized representative who has indicated his/her understanding and acceptance.     Dental Advisory Given  Plan Discussed with: CRNA  Anesthesia Plan Comments:         Anesthesia Quick Evaluation

## 2024-03-17 NOTE — Anesthesia Procedure Notes (Signed)
 Date/Time: 03/17/2024 9:33 AM  Performed by: Barbarann Verneita RAMAN, CRNAPre-anesthesia Checklist: Patient identified, Emergency Drugs available, Suction available, Timeout performed and Patient being monitored Patient Re-evaluated:Patient Re-evaluated prior to induction Oxygen Delivery Method: Nasal Cannula

## 2024-03-17 NOTE — Op Note (Signed)
 [facility] Patient Name: Patricia Navarro Procedure Date: 03/17/2024 9:21 AM MRN: [id number] Date of Birth: 03-19-1981 Attending MD: Carlin POUR. Cindie , [place], [id number] CSN: [id number] Age: 43 Admit Type: Outpatient Procedure:                Colonoscopy Indications:              Screening patient at increased risk: Family history                            of colorectal cancer in multiple relatives Providers:                Carlin POUR. Cindie, DO, Crystal Page, Jon Loge Referring MD:              Medicines:                See the Anesthesia note for documentation of the                            administered medications Complications:            No immediate complications. Estimated Blood Loss:     Estimated blood loss was minimal. Procedure:                Pre-Anesthesia Assessment:                           - The anesthesia plan was to use monitored                            anesthesia care (MAC).                           After obtaining informed consent, the colonoscope                            was passed under direct vision. Throughout the                            procedure, the patient's blood pressure, pulse, and                            oxygen saturations were monitored continuously. The                            [id number]-HQ190L ([id number]) scope was introduced through                            the anus and advanced to the the terminal ileum,                            with identification of the appendiceal orifice and                            IC valve. The colonoscopy was  performed without                            difficulty. The patient tolerated the procedure                            well. The quality of the bowel preparation was                            evaluated using the BBPS [facility] Bowel Preparation                            Scale) with scores of: Right Colon = 3, Transverse                            Colon = 3 and  Left Colon = 3 (entire mucosa seen                            well with no residual staining, small fragments of                            stool or opaque liquid). The total BBPS score                            equals 9. Scope In: 9:39:23 AM Scope Out: 10:01:39 AM Scope Withdrawal Time: 0 hours 17 minutes 21 seconds  Total Procedure Duration: 0 hours 22 minutes 16 seconds  Findings:      Hemorrhoids were found on perianal exam.      Non-bleeding internal hemorrhoids were found during retroflexion. The       hemorrhoids were moderate.      Two sessile polyps were found in the ascending colon. The polyps were 5       to 8 mm in size. These polyps were removed with a cold snare. Resection       and retrieval were complete.      The terminal ileum appeared normal.      The exam was otherwise without abnormality. Impression:               - Hemorrhoids found on perianal exam.                           - Non-bleeding internal hemorrhoids.                           - Two 5 to 8 mm polyps in the ascending colon,                            removed with a cold snare. Resected and retrieved.                           - The examined portion of the ileum was normal.                           - The examination was otherwise normal. Moderate  Sedation:      Per Anesthesia Care Recommendation:           - Patient has a contact number available for                            emergencies. The signs and symptoms of potential                            delayed complications were discussed with the                            patient. Return to normal activities tomorrow.                            Written discharge instructions were provided to the                            patient.                           - Resume previous diet.                           - Continue present medications.                           - Await pathology results.                           - Repeat colonoscopy in 5 years for  surveillance                            and family history.                           - Return to GI clinic PRN.                           - Consider hemorrhoid banding if having symptoms Procedure Code(s):        --- Professional ---                           [phone], Colonoscopy, flexible; with removal of                            tumor(s), polyp(s), or other lesion(s) by snare                            technique Diagnosis Code(s):        --- Professional ---                           Z80.0, Family history of malignant neoplasm of                            digestive organs  D12.2, Benign neoplasm of ascending colon                           K64.8, Other hemorrhoids CPT copyright 2022 American Medical Association. All rights reserved. The codes documented in this report are preliminary and upon coder review may  be revised to meet current compliance requirements. Carlin POUR. Cindie, DO Carlin POUR. Cindie, DO 03/17/2024 10:07:11 AM This report has been signed electronically. Number of Addenda: 0

## 2024-03-17 NOTE — Anesthesia Postprocedure Evaluation (Signed)
 Anesthesia Post Note  Patient: Patricia Navarro  Procedure(s) Performed: COLONOSCOPY  Patient location during evaluation: Endoscopy Anesthesia Type: General Level of consciousness: awake and alert Pain management: pain level controlled Vital Signs Assessment: post-procedure vital signs reviewed and stable Respiratory status: spontaneous breathing, nonlabored ventilation and respiratory function stable Cardiovascular status: blood pressure returned to baseline and stable Postop Assessment: no apparent nausea or vomiting Anesthetic complications: no   There were no known notable events for this encounter.   Last Vitals:  Vitals:   03/17/24 0828 03/17/24 1005  BP: 107/71 102/67  Pulse: 69 66  Resp: 14 13  Temp: 36.7 C 36.4 C  SpO2: 100% 99%    Last Pain:  Vitals:   03/17/24 1010  TempSrc:   PainSc: 0-No pain                 Haydan Mansouri L Zakariyah Freimark

## 2024-03-17 NOTE — Transfer of Care (Signed)
 Immediate Anesthesia Transfer of Care Note  Patient: Patricia Navarro  Procedure(s) Performed: COLONOSCOPY  Patient Location: Endoscopy Unit  Anesthesia Type:General  Level of Consciousness: drowsy  Airway & Oxygen Therapy: Patient Spontanous Breathing  Post-op Assessment: Report given to RN and Post -op Vital signs reviewed and stable  Post vital signs: Reviewed and stable  Last Vitals:  Vitals Value Taken Time  BP 102/67 03/17/24 10:05  Temp 36.4 C 03/17/24 10:05  Pulse 66 03/17/24 10:05  Resp 13 03/17/24 10:05  SpO2 99 % 03/17/24 10:05    Last Pain:  Vitals:   03/17/24 1005  TempSrc: Oral  PainSc: 0-No pain      Patients Stated Pain Goal: 6 (03/17/24 0828)  Complications: No notable events documented.

## 2024-03-17 NOTE — Discharge Instructions (Addendum)
  Colonoscopy Discharge Instructions  Read the instructions outlined below and refer to this sheet in the next few weeks. These discharge instructions provide you with general information on caring for yourself after you leave the hospital. Your doctor may also give you specific instructions. While your treatment has been planned according to the most current medical practices available, unavoidable complications occasionally occur.   ACTIVITY You may resume your regular activity, but move at a slower pace for the next 24 hours.  Take frequent rest periods for the next 24 hours.  Walking will help get rid of the air and reduce the bloated feeling in your belly (abdomen).  No driving for 24 hours (because of the medicine (anesthesia) used during the test).   Do not sign any important legal documents or operate any machinery for 24 hours (because of the anesthesia used during the test).  NUTRITION Drink plenty of fluids.  You may resume your normal diet as instructed by your doctor.  Begin with a light meal and progress to your normal diet. Heavy or fried foods are harder to digest and may make you feel sick to your stomach (nauseated).  Avoid alcoholic beverages for 24 hours or as instructed.  MEDICATIONS You may resume your normal medications unless your doctor tells you otherwise.  WHAT YOU CAN EXPECT TODAY Some feelings of bloating in the abdomen.  Passage of more gas than usual.  Spotting of blood in your stool or on the toilet paper.  IF YOU HAD POLYPS REMOVED DURING THE COLONOSCOPY: No aspirin products for 7 days or as instructed.  No alcohol for 7 days or as instructed.  Eat a soft diet for the next 24 hours.  FINDING OUT THE RESULTS OF YOUR TEST Not all test results are available during your visit. If your test results are not back during the visit, make an appointment with your caregiver to find out the results. Do not assume everything is normal if you have not heard from your  caregiver or the medical facility. It is important for you to follow up on all of your test results.  SEEK IMMEDIATE MEDICAL ATTENTION IF: You have more than a spotting of blood in your stool.  Your belly is swollen (abdominal distention).  You are nauseated or vomiting.  You have a temperature over 101.  You have abdominal pain or discomfort that is severe or gets worse throughout the day.   Your colonoscopy revealed 2 polyp(s) which I removed successfully. Await pathology results, my office will contact you. I recommend repeating colonoscopy in 5 years for surveillance purposes.   You also internal and external hemorrhoids. We can set you up for hemorrhoid banding if you are interested. Otherwise follow up with GI as needed.   I hope you have a great rest of your week!  Patricia Navarro. Cindie, D.O. Gastroenterology and Hepatology [facility]

## 2024-03-17 NOTE — Telephone Encounter (Signed)
 [name]: please arrange banding appt with me. Thanks!

## 2024-03-17 NOTE — Interval H&P Note (Signed)
 History and Physical Interval Note:  03/17/2024 9:22 AM  Patricia Navarro  has presented today for surgery, with the diagnosis of family history colon cancer.  The various methods of treatment have been discussed with the patient and family. After consideration of risks, benefits and other options for treatment, the patient has consented to  Procedure(s) with comments: COLONOSCOPY (N/A) - 1:00pm, asa 2, pt knows to arrive at 7:45 as a surgical intervention.  The patient's history has been reviewed, patient examined, no change in status, stable for surgery.  I have reviewed the patient's chart and labs.  Questions were answered to the patient's satisfaction.     Patricia Navarro

## 2024-03-18 ENCOUNTER — Encounter (HOSPITAL_COMMUNITY): Payer: Self-pay | Admitting: Internal Medicine

## 2024-03-18 LAB — SURGICAL PATHOLOGY

## 2024-03-19 ENCOUNTER — Encounter (HOSPITAL_COMMUNITY): Payer: Self-pay

## 2024-03-19 ENCOUNTER — Ambulatory Visit (HOSPITAL_COMMUNITY)
Admission: RE | Admit: 2024-03-19 | Discharge: 2024-03-19 | Disposition: A | Discharge status: Home / Self Care | Payer: PRIVATE HEALTH INSURANCE | Source: Ambulatory Visit | Attending: Family Medicine | Admitting: Family Medicine

## 2024-03-19 DIAGNOSIS — N644 Mastodynia: Secondary | ICD-10-CM

## 2024-03-23 LAB — GENECONNECT MOLECULAR SCREEN: Genetic Analysis Overall Interpretation: NEGATIVE

## 2024-03-25 ENCOUNTER — Ambulatory Visit (INDEPENDENT_AMBULATORY_CARE_PROVIDER_SITE_OTHER): Admitting: Gastroenterology

## 2024-03-25 ENCOUNTER — Encounter: Payer: Self-pay | Admitting: Gastroenterology

## 2024-03-25 VITALS — BP 135/78 | HR 78 | Temp 98.2°F | Ht 64.0 in | Wt 182.4 lb

## 2024-03-25 DIAGNOSIS — K642 Third degree hemorrhoids: Secondary | ICD-10-CM | POA: Diagnosis not present

## 2024-03-25 NOTE — Progress Notes (Signed)
       [facility] BANDING PROCEDURE NOTE  Patricia Navarro is a 43 y.o. female presenting today for consideration of hemorrhoid banding. Last colonoscopy Colonoscopy Aug 2025: non-bleeding internal hemorrhoids, two 5-8 mm polyps, tubular adenoma and sessile serrated polyps. She has had chronic rectal bleeding, prolapse, itching, flares, dating back to her first child at age 13.    The patient presents with symptomatic grade 3 hemorrhoids, unresponsive to maximal medical therapy, requesting rubber band ligation of her hemorrhoidal disease. All risks, benefits, and alternative forms of therapy were described and informed consent was obtained.  The decision was made to band the left lateral internal hemorrhoid, and the [facility] O'Regan System was used to perform band ligation without complication. Digital anorectal examination was then performed to assure proper positioning of the band, and to adjust the banded tissue as required. The patient was discharged home without pain or other issues. Dietary and behavioral recommendations were given, along with follow-up instructions. The patient will return in several weeks for followup and possible additional banding as required.  No complications were encountered and the patient tolerated the procedure well.   Therisa MICAEL Stager, PhD, ANP-BC [facility]

## 2024-03-25 NOTE — Patient Instructions (Signed)
  Please avoid straining.  You should limit your toilet time to 2-3 minutes at the most.   I recommend Benefiber 2 teaspoons each morning in the beverage of your choice!  Please call me with any concerns or issues!  I will see you in follow-up for additional banding in several weeks.     It was a pleasure to see you today. I want to create trusting relationships with patients and provide genuine, compassionate, and quality care. I truly value your feedback, so please be on the lookout for a survey regarding your visit with me today. I appreciate your time in completing this!    Annitta Needs, PhD, ANP-BC [facility]

## 2024-04-07 ENCOUNTER — Ambulatory Visit: Payer: Self-pay | Admitting: Internal Medicine

## 2024-05-06 ENCOUNTER — Ambulatory Visit: Admitting: Gastroenterology

## 2024-05-06 ENCOUNTER — Telehealth (INDEPENDENT_AMBULATORY_CARE_PROVIDER_SITE_OTHER): Payer: Self-pay | Admitting: Gastroenterology

## 2024-05-06 ENCOUNTER — Encounter: Payer: Self-pay | Admitting: Gastroenterology

## 2024-05-06 VITALS — BP 107/73 | HR 87 | Temp 97.8°F | Ht 64.0 in | Wt 178.8 lb

## 2024-05-06 DIAGNOSIS — K642 Third degree hemorrhoids: Secondary | ICD-10-CM

## 2024-05-06 NOTE — Progress Notes (Signed)
    [facility] BANDING PROCEDURE NOTE  Patricia Navarro is a 43 y.o. female presenting today for consideration of hemorrhoid banding. Last colonoscopy  Aug 2025: non-bleeding internal hemorrhoids, two 5-8 mm polyps, tubular adenoma and sessile serrated polyps. She has had chronic rectal bleeding, prolapse, itching, flares, dating back to her first child at age 51. She has had banding of the left lateral. After the last banding, she had a vagal response. She still would like to complete this today and will stay after procedure to make sure she is well    The patient presents with symptomatic grade 3 hemorrhoids, unresponsive to maximal medical therapy, requesting rubber band ligation of her hemorrhoidal disease. All risks, benefits, and alternative forms of therapy were described and informed consent was obtained.    The decision was made to band the right anterior internal hemorrhoid, and the [facility] O'Regan System was used to perform band ligation without complication. Digital anorectal examination was then performed to assure proper positioning of the band, and to adjust the banded tissue as required. The patient was discharged home without pain or other issues. Dietary and behavioral recommendations were given, along with follow-up instructions. The patient will return in several weeks for followup and possible additional banding as required.  No complications were encountered and the patient tolerated the procedure well.   Patient stayed for about 25 minutes following procedure and had no complications. She is appropriate for discharge.   Therisa MICAEL Stager, PhD, ANP-BC [facility]

## 2024-05-06 NOTE — Patient Instructions (Signed)

## 2024-05-06 NOTE — Telephone Encounter (Signed)
 Patient was seen today and Patricia Stager, NP wanted patient to follow up in 2 weeks. Anna's scheduled is booked for this month and patient said she was OK in coming back in November. I scheduled OV with Patricia for 11/12 at 10am and patient is aware.

## 2024-06-17 ENCOUNTER — Ambulatory Visit: Admitting: Gastroenterology

## 2024-09-11 ENCOUNTER — Encounter: Payer: Self-pay | Admitting: *Deleted

## 2024-10-20 ENCOUNTER — Ambulatory Visit: Admitting: Neurology
